# Patient Record
Sex: Female | Born: 1960 | Race: White | Hispanic: No | Marital: Married | Smoking: Former smoker
Health system: Southern US, Community
[De-identification: ages and names within clinical notes are randomized; demographics above are authoritative.]

## PROBLEM LIST (undated history)

## (undated) DIAGNOSIS — G4730 Sleep apnea, unspecified: Secondary | ICD-10-CM

## (undated) DIAGNOSIS — I10 Essential (primary) hypertension: Secondary | ICD-10-CM

## (undated) DIAGNOSIS — R112 Nausea with vomiting, unspecified: Secondary | ICD-10-CM

## (undated) DIAGNOSIS — R51 Headache: Secondary | ICD-10-CM

## (undated) DIAGNOSIS — J45909 Unspecified asthma, uncomplicated: Secondary | ICD-10-CM

## (undated) DIAGNOSIS — Z9889 Other specified postprocedural states: Secondary | ICD-10-CM

## (undated) HISTORY — PX: ROTATOR CUFF REPAIR: SHX139

## (undated) HISTORY — PX: CHOLECYSTECTOMY: SHX55

## (undated) HISTORY — PX: ABDOMINAL HYSTERECTOMY: SHX81

## (undated) HISTORY — PX: BUNIONECTOMY: SHX129

## (undated) HISTORY — PX: JOINT REPLACEMENT: SHX530

## (undated) HISTORY — PX: APPENDECTOMY: SHX54

## (undated) HISTORY — PX: TONSILLECTOMY: SUR1361

## (undated) HISTORY — PX: LAPAROSCOPIC GASTRIC SLEEVE RESECTION: SHX5895

---

## 1999-04-18 ENCOUNTER — Other Ambulatory Visit: Admission: RE | Admit: 1999-04-18 | Discharge: 1999-04-18 | Payer: Self-pay | Admitting: Obstetrics and Gynecology

## 1999-05-26 ENCOUNTER — Emergency Department (HOSPITAL_COMMUNITY): Admission: EM | Admit: 1999-05-26 | Discharge: 1999-05-26 | Payer: Self-pay | Admitting: Emergency Medicine

## 1999-09-22 ENCOUNTER — Emergency Department (HOSPITAL_COMMUNITY): Admission: EM | Admit: 1999-09-22 | Discharge: 1999-09-22 | Payer: Self-pay

## 1999-09-28 ENCOUNTER — Encounter: Payer: Self-pay | Admitting: Emergency Medicine

## 1999-09-28 ENCOUNTER — Emergency Department (HOSPITAL_COMMUNITY): Admission: EM | Admit: 1999-09-28 | Discharge: 1999-09-28 | Payer: Self-pay | Admitting: Emergency Medicine

## 2000-01-10 ENCOUNTER — Emergency Department (HOSPITAL_COMMUNITY): Admission: EM | Admit: 2000-01-10 | Discharge: 2000-01-10 | Payer: Self-pay | Admitting: Emergency Medicine

## 2000-01-17 ENCOUNTER — Encounter: Admission: RE | Admit: 2000-01-17 | Discharge: 2000-01-17 | Payer: Self-pay | Admitting: Otolaryngology

## 2000-01-17 ENCOUNTER — Encounter: Payer: Self-pay | Admitting: Otolaryngology

## 2000-02-02 ENCOUNTER — Ambulatory Visit (HOSPITAL_BASED_OUTPATIENT_CLINIC_OR_DEPARTMENT_OTHER): Admission: RE | Admit: 2000-02-02 | Discharge: 2000-02-02 | Payer: Self-pay | Admitting: Otolaryngology

## 2000-02-04 ENCOUNTER — Emergency Department (HOSPITAL_COMMUNITY): Admission: EM | Admit: 2000-02-04 | Discharge: 2000-02-04 | Payer: Self-pay | Admitting: Emergency Medicine

## 2000-03-31 ENCOUNTER — Emergency Department (HOSPITAL_COMMUNITY): Admission: EM | Admit: 2000-03-31 | Discharge: 2000-03-31 | Payer: Self-pay | Admitting: Emergency Medicine

## 2000-04-24 ENCOUNTER — Emergency Department (HOSPITAL_COMMUNITY): Admission: EM | Admit: 2000-04-24 | Discharge: 2000-04-25 | Payer: Self-pay

## 2000-05-29 ENCOUNTER — Encounter: Payer: Self-pay | Admitting: Emergency Medicine

## 2000-05-29 ENCOUNTER — Emergency Department (HOSPITAL_COMMUNITY): Admission: EM | Admit: 2000-05-29 | Discharge: 2000-05-29 | Payer: Self-pay | Admitting: Emergency Medicine

## 2000-08-25 ENCOUNTER — Inpatient Hospital Stay (HOSPITAL_COMMUNITY): Admission: EM | Admit: 2000-08-25 | Discharge: 2000-08-31 | Payer: Self-pay | Admitting: Emergency Medicine

## 2000-08-25 ENCOUNTER — Encounter: Payer: Self-pay | Admitting: Emergency Medicine

## 2000-08-29 ENCOUNTER — Encounter: Payer: Self-pay | Admitting: Cardiology

## 2000-08-29 ENCOUNTER — Encounter: Payer: Self-pay | Admitting: Interventional Cardiology

## 2000-09-06 ENCOUNTER — Emergency Department (HOSPITAL_COMMUNITY): Admission: EM | Admit: 2000-09-06 | Discharge: 2000-09-06 | Payer: Self-pay | Admitting: Emergency Medicine

## 2000-09-06 ENCOUNTER — Encounter: Payer: Self-pay | Admitting: Emergency Medicine

## 2002-06-01 ENCOUNTER — Encounter: Payer: Self-pay | Admitting: *Deleted

## 2002-06-01 ENCOUNTER — Inpatient Hospital Stay (HOSPITAL_COMMUNITY): Admission: EM | Admit: 2002-06-01 | Discharge: 2002-06-04 | Payer: Self-pay | Admitting: Emergency Medicine

## 2002-06-02 ENCOUNTER — Encounter (INDEPENDENT_AMBULATORY_CARE_PROVIDER_SITE_OTHER): Payer: Self-pay | Admitting: Cardiology

## 2002-06-03 ENCOUNTER — Encounter: Payer: Self-pay | Admitting: Family Medicine

## 2002-06-08 ENCOUNTER — Encounter: Admission: RE | Admit: 2002-06-08 | Discharge: 2002-06-08 | Payer: Self-pay | Admitting: Family Medicine

## 2002-09-23 ENCOUNTER — Emergency Department (HOSPITAL_COMMUNITY): Admission: EM | Admit: 2002-09-23 | Discharge: 2002-09-23 | Payer: Self-pay | Admitting: Emergency Medicine

## 2002-09-23 ENCOUNTER — Encounter: Payer: Self-pay | Admitting: Emergency Medicine

## 2003-02-25 ENCOUNTER — Inpatient Hospital Stay (HOSPITAL_COMMUNITY): Admission: EM | Admit: 2003-02-25 | Discharge: 2003-03-01 | Payer: Self-pay | Admitting: Emergency Medicine

## 2003-02-25 ENCOUNTER — Encounter: Payer: Self-pay | Admitting: Emergency Medicine

## 2003-02-26 ENCOUNTER — Encounter: Payer: Self-pay | Admitting: Cardiology

## 2003-09-07 ENCOUNTER — Emergency Department (HOSPITAL_COMMUNITY): Admission: EM | Admit: 2003-09-07 | Discharge: 2003-09-07 | Payer: Self-pay | Admitting: Emergency Medicine

## 2003-11-10 ENCOUNTER — Emergency Department (HOSPITAL_COMMUNITY): Admission: EM | Admit: 2003-11-10 | Discharge: 2003-11-10 | Payer: Self-pay | Admitting: Emergency Medicine

## 2003-12-03 ENCOUNTER — Ambulatory Visit (HOSPITAL_COMMUNITY): Admission: RE | Admit: 2003-12-03 | Discharge: 2003-12-03 | Payer: Self-pay | Admitting: Orthopaedic Surgery

## 2003-12-06 ENCOUNTER — Emergency Department (HOSPITAL_COMMUNITY): Admission: EM | Admit: 2003-12-06 | Discharge: 2003-12-06 | Payer: Self-pay | Admitting: Emergency Medicine

## 2004-05-12 ENCOUNTER — Ambulatory Visit (HOSPITAL_COMMUNITY): Admission: RE | Admit: 2004-05-12 | Discharge: 2004-05-12 | Payer: Self-pay | Admitting: Orthopaedic Surgery

## 2004-05-17 ENCOUNTER — Inpatient Hospital Stay (HOSPITAL_COMMUNITY): Admission: RE | Admit: 2004-05-17 | Discharge: 2004-05-22 | Payer: Self-pay | Admitting: Orthopaedic Surgery

## 2005-08-03 ENCOUNTER — Inpatient Hospital Stay (HOSPITAL_COMMUNITY): Admission: RE | Admit: 2005-08-03 | Discharge: 2005-08-08 | Payer: Self-pay | Admitting: Orthopaedic Surgery

## 2005-10-01 ENCOUNTER — Ambulatory Visit: Admission: RE | Admit: 2005-10-01 | Discharge: 2005-10-01 | Payer: Self-pay | Admitting: Orthopaedic Surgery

## 2006-08-19 ENCOUNTER — Ambulatory Visit (HOSPITAL_COMMUNITY): Admission: RE | Admit: 2006-08-19 | Discharge: 2006-08-20 | Payer: Self-pay | Admitting: Orthopaedic Surgery

## 2006-11-22 ENCOUNTER — Encounter: Admission: RE | Admit: 2006-11-22 | Discharge: 2006-11-22 | Payer: Self-pay | Admitting: Orthopaedic Surgery

## 2006-12-04 ENCOUNTER — Encounter: Admission: RE | Admit: 2006-12-04 | Discharge: 2006-12-04 | Payer: Self-pay | Admitting: Orthopaedic Surgery

## 2007-10-06 ENCOUNTER — Ambulatory Visit: Payer: Self-pay | Admitting: Internal Medicine

## 2007-11-09 ENCOUNTER — Other Ambulatory Visit: Payer: Self-pay

## 2007-11-09 ENCOUNTER — Ambulatory Visit: Payer: Self-pay | Admitting: Emergency Medicine

## 2007-11-14 ENCOUNTER — Ambulatory Visit: Payer: Self-pay | Admitting: Family Medicine

## 2007-12-19 ENCOUNTER — Ambulatory Visit: Payer: Self-pay | Admitting: Family Medicine

## 2007-12-25 ENCOUNTER — Ambulatory Visit: Payer: Self-pay | Admitting: Emergency Medicine

## 2008-01-03 ENCOUNTER — Ambulatory Visit: Payer: Self-pay | Admitting: Family Medicine

## 2008-01-07 ENCOUNTER — Encounter: Admission: RE | Admit: 2008-01-07 | Discharge: 2008-01-07 | Payer: Self-pay | Admitting: Internal Medicine

## 2008-01-13 ENCOUNTER — Ambulatory Visit: Payer: Self-pay | Admitting: Family Medicine

## 2008-06-21 ENCOUNTER — Ambulatory Visit: Payer: Self-pay | Admitting: Internal Medicine

## 2008-10-13 ENCOUNTER — Ambulatory Visit: Payer: Self-pay | Admitting: Family Medicine

## 2008-10-19 ENCOUNTER — Ambulatory Visit: Payer: Self-pay | Admitting: Otolaryngology

## 2008-10-31 ENCOUNTER — Ambulatory Visit: Payer: Self-pay | Admitting: Family Medicine

## 2008-11-22 ENCOUNTER — Inpatient Hospital Stay: Payer: Self-pay | Admitting: Internal Medicine

## 2008-12-13 ENCOUNTER — Ambulatory Visit: Payer: Self-pay | Admitting: Internal Medicine

## 2009-01-21 IMAGING — CR DG CHEST 1V PORT
1 series · 1 of 1 positions shown · non-contrast
Comparison: none

REASON FOR EXAM: cp/sob
COMMENTS:

PROCEDURE:     DXR - DXR PORTABLE CHEST SINGLE VIEW  - November 22, 2008 [DATE]
RESULT:     Comparison is made to a prior study dated 11/09/2007.
The lungs are clear. The cardiac silhouette and visualized bony skeleton are
unremarkable.

[view not recorded]
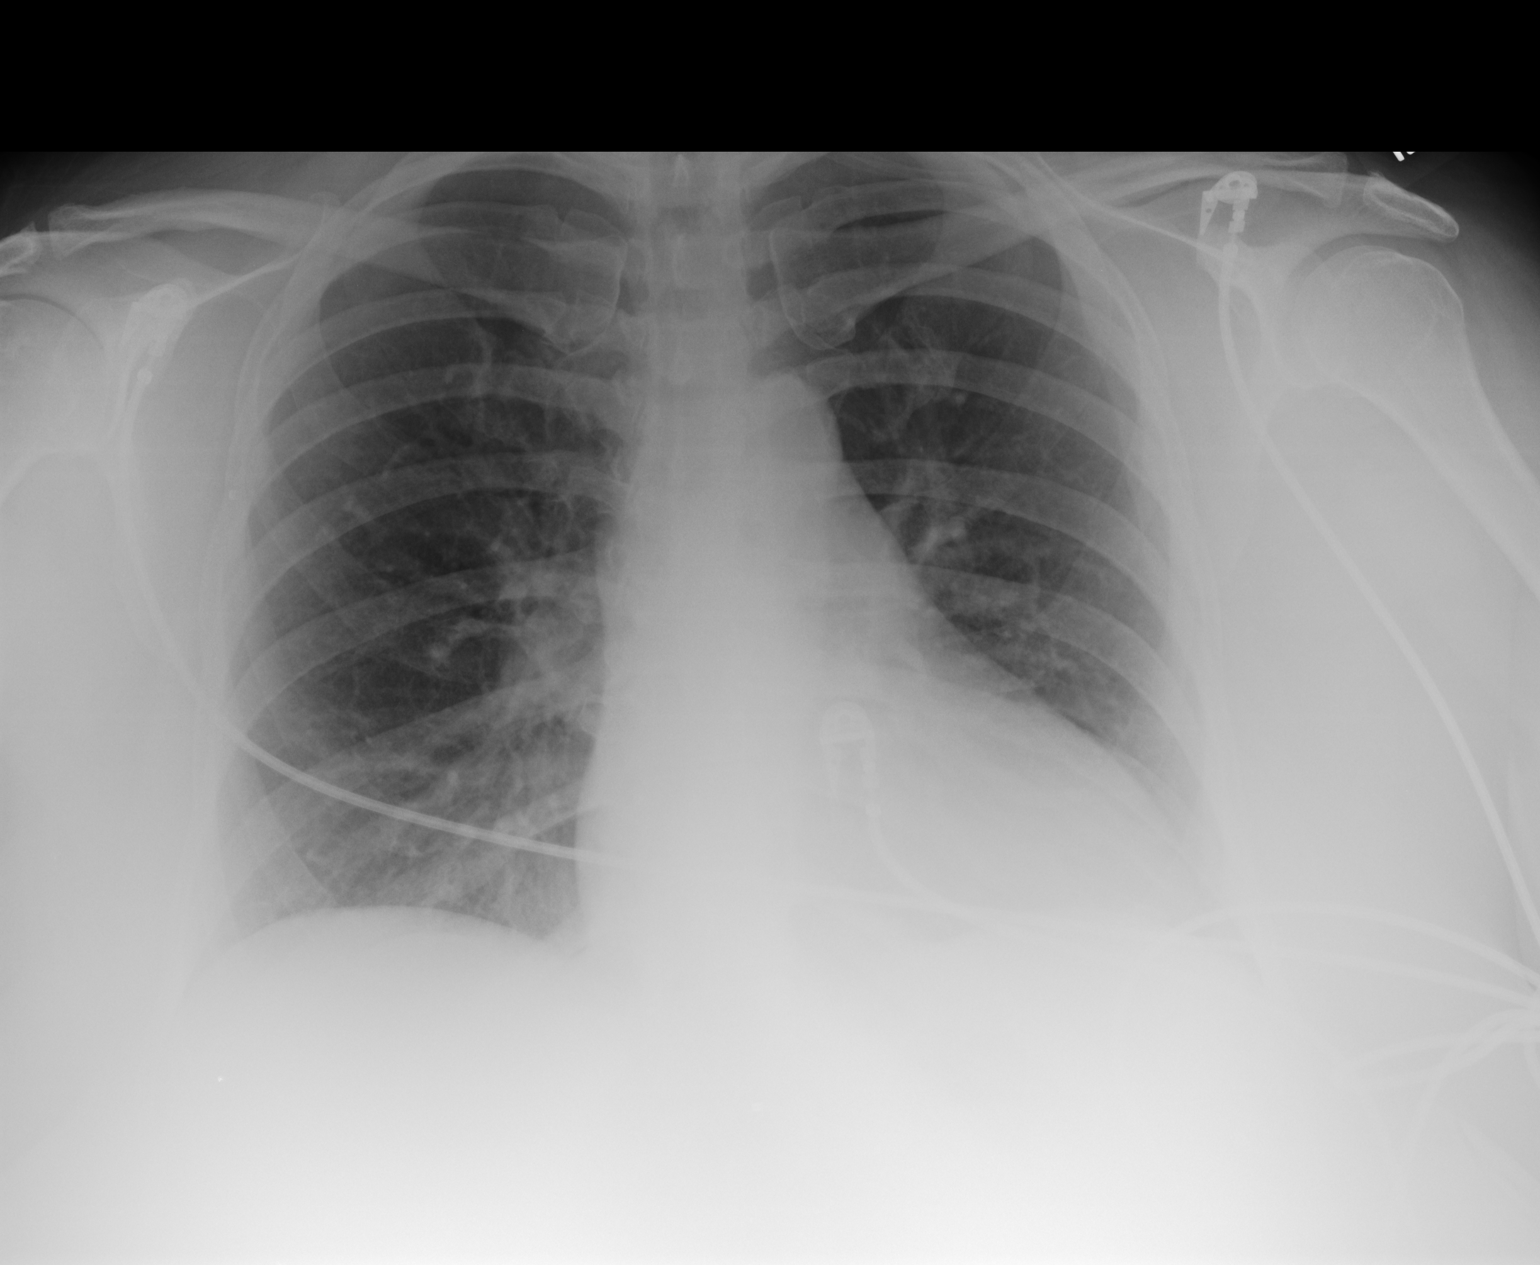

[1 of 1 positions shown; findings below may reference images not displayed]

IMPRESSION: Chest radiograph without evidence of acute cardiopulmonary
disease.

## 2009-03-02 ENCOUNTER — Ambulatory Visit: Payer: Self-pay | Admitting: Internal Medicine

## 2009-06-14 ENCOUNTER — Ambulatory Visit: Payer: Self-pay | Admitting: Family Medicine

## 2009-11-08 ENCOUNTER — Inpatient Hospital Stay: Payer: Self-pay | Admitting: Internal Medicine

## 2010-03-06 ENCOUNTER — Ambulatory Visit: Payer: Self-pay | Admitting: Unknown Physician Specialty

## 2010-03-20 ENCOUNTER — Emergency Department: Payer: Self-pay | Admitting: Unknown Physician Specialty

## 2010-04-14 ENCOUNTER — Ambulatory Visit: Payer: Self-pay | Admitting: Surgery

## 2010-04-18 ENCOUNTER — Ambulatory Visit: Payer: Self-pay | Admitting: Surgery

## 2010-04-21 ENCOUNTER — Ambulatory Visit: Payer: Self-pay | Admitting: Internal Medicine

## 2010-04-26 ENCOUNTER — Ambulatory Visit: Payer: Self-pay | Admitting: Cardiovascular Disease

## 2010-04-26 ENCOUNTER — Encounter: Payer: Self-pay | Admitting: Otolaryngology

## 2010-04-30 ENCOUNTER — Ambulatory Visit: Payer: Self-pay | Admitting: Internal Medicine

## 2010-05-01 ENCOUNTER — Emergency Department: Payer: Self-pay | Admitting: Emergency Medicine

## 2010-05-05 ENCOUNTER — Encounter: Payer: Self-pay | Admitting: Otolaryngology

## 2010-06-06 ENCOUNTER — Ambulatory Visit: Payer: Self-pay | Admitting: Otolaryngology

## 2010-07-22 ENCOUNTER — Ambulatory Visit: Payer: Self-pay | Admitting: Internal Medicine

## 2010-08-17 ENCOUNTER — Ambulatory Visit: Payer: Self-pay | Admitting: Specialist

## 2010-08-28 ENCOUNTER — Inpatient Hospital Stay (HOSPITAL_COMMUNITY): Admission: RE | Admit: 2010-08-28 | Discharge: 2010-08-29 | Payer: Self-pay | Admitting: Orthopaedic Surgery

## 2010-11-15 ENCOUNTER — Encounter: Payer: Self-pay | Admitting: Otolaryngology

## 2010-12-06 ENCOUNTER — Encounter: Payer: Self-pay | Admitting: Otolaryngology

## 2011-01-04 ENCOUNTER — Encounter: Payer: Self-pay | Admitting: Otolaryngology

## 2011-01-17 ENCOUNTER — Ambulatory Visit: Payer: Self-pay | Admitting: Cardiovascular Disease

## 2011-01-17 HISTORY — PX: CARDIAC CATHETERIZATION: SHX172

## 2011-01-17 LAB — COMPREHENSIVE METABOLIC PANEL
ALT: 21 U/L (ref 0–35)
AST: 25 U/L (ref 0–37)
Albumin: 3.8 g/dL (ref 3.5–5.2)
Alkaline Phosphatase: 98 U/L (ref 39–117)
BUN: 12 mg/dL (ref 6–23)
CO2: 28 mEq/L (ref 19–32)
Calcium: 9.6 mg/dL (ref 8.4–10.5)
Chloride: 102 mEq/L (ref 96–112)
Creatinine, Ser: 0.75 mg/dL (ref 0.4–1.2)
GFR calc Af Amer: >60 mL/min (ref >60)
GFR calc non Af Amer: >60 mL/min (ref >60)
Glucose, Bld: 101 mg/dL — ABNORMAL HIGH (ref 70–99)
Potassium: 4 mEq/L (ref 3.5–5.1)
Sodium: 139 mEq/L (ref 135–145)
Total Bilirubin: 0.3 mg/dL (ref 0.3–1.2)
Total Protein: 6.6 g/dL (ref 6.0–8.3)

## 2011-01-17 LAB — URINALYSIS, ROUTINE W REFLEX MICROSCOPIC
Bilirubin Urine: NEGATIVE
Glucose, UA: NEGATIVE mg/dL
Hgb urine dipstick: NEGATIVE
Ketones, ur: NEGATIVE mg/dL
Nitrite: NEGATIVE
Protein, ur: NEGATIVE mg/dL
Specific Gravity, Urine: 1.035 — ABNORMAL HIGH (ref 1.005–1.030)
Urobilinogen, UA: 0.2 mg/dL (ref 0.0–1.0)
pH: 5.5 (ref 5.0–8.0)

## 2011-01-17 LAB — CBC
HCT: 36.1 % (ref 36.0–46.0)
Hemoglobin: 11.7 g/dL — ABNORMAL LOW (ref 12.0–15.0)
MCH: 27.9 pg (ref 26.0–34.0)
MCHC: 32.4 g/dL (ref 30.0–36.0)
MCV: 86.2 fL (ref 78.0–100.0)
Platelets: 343 10*3/uL (ref 150–400)
RBC: 4.19 MIL/uL (ref 3.87–5.11)
RDW: 13.5 % (ref 11.5–15.5)
WBC: 7.5 10*3/uL (ref 4.0–10.5)

## 2011-01-17 LAB — PROTIME-INR
INR: 0.94 (ref 0.00–1.49)
Prothrombin Time: 12.8 seconds (ref 11.6–15.2)

## 2011-01-17 LAB — SURGICAL PCR SCREEN
MRSA, PCR: NEGATIVE
Staphylococcus aureus: NEGATIVE

## 2011-01-17 LAB — APTT: aPTT: 27 seconds (ref 24–37)

## 2011-03-23 NOTE — Discharge Summary (Signed)
Karina [name], Karina [name]                        ACCOUNT NO.:  [id number]   MEDICAL RECORD NO.:  [id number]                   PATIENT TYPE:  INP   LOCATION:  2029                                 FACILITY:  [facility]   PHYSICIAN:  Alvira Philips, M.D.                DATE OF BIRTH:  1961/01/13   DATE OF ADMISSION:  06/01/2002  DATE OF DISCHARGE:  06/04/2002                                 DISCHARGE SUMMARY   DISCHARGE DIAGNOSES:  1. Atypical chest pain.  2. Bipolar disorder.  3. Hypertension.  4. Asthma.  5. Hypokalemia.  6. Vaginal discharge.   DISCHARGE MEDICATIONS:  1. Darvocet-N 50 one or two tablets p.o. q.4-6h. p.r.n. pain.  2. Cardizem CD 90 mg one tablet b.i.d.  3. Aspirin 325 mg one tab q.d.  4. Wellbutrin 100 mg p.o. b.i.d.  5. Prozac 80 mg p.o. q.h.s.  6. Doxepin 50 mg p.o. q.h.s.  7. Prednisone taper, the patient is to take 40 mg q.d. x3 days, then 20 mg     q.d. x3 days, then quit.  8. The patient is to discontinue her use of Accupril and hydrochlorothiazide     as previously instructed as home medications.   ACTIVITY:  The patient is to return to work on Monday, June 08, 2002.   DIAGNOSIS:  The patient is to maintain a low sodium diet.   SPECIAL INSTRUCTIONS:  The patient is to try sublingual nitroglycerin first  for her chest pain if reoccurring.  The patient is to take 0.05% tablet one  tab every 5 minutes x3 doses for chest pain, and then the patient is  instructed to use pain relief from the Darvocet as well.   FOLLOWUP:  The patient is to follow up with Dr. Sharyn Lull at his office on  June 18, 2002 at 2 p.m.  Phone number is [phone].   CHIEF COMPLAINT:  The patient was admitted for a chief complaint of chest  pain for three weeks with worsening on the date of admission.   HISTORY OF PRESENT ILLNESS:  The patient is a 50 year old white female with  a history of bipolar disorder, obesity, hypertension, and positive family  history of vascular disease who  presented with a three-week history of  shortness of breath and chest pain.  The patient says she originally thought  it was asthma causing her shortness of breath and chest pain, actually was  seen at [facility].  An EKG at her primary care physician showed no  abnormalities.  The patient says her chest pain never really went away.  She  described it as a elephant sitting on the side of her chest with a 7/10 pain  scale, and that the pain radiated to her left shoulder.  She was complaining  that her chest pain and shortness of breath were worse with walking and with  any type of activity.  What actually brought the patient  to the hospital was  the patient was sitting at her desk at work when she began to feel light-  headed and felt like she was floating.  She also claimed that she was hot  and clammy and had palpitations.  She went back to see her physician at  Urgent Care, and a second EKG obtained showed some mild changes.  The  patient was sent to the hospital for workup.   PHYSICAL EXAMINATION:  VITAL SIGNS:  Temperature 98.7, pulse 88, respiratory  rate 17, blood pressure 102/61, saturation 99% on room air.  GENERAL:  She is a pleasant, very obese lady who is working hard to breathe  when she talks at rest.  The patient was much more comfortable with her  breathing without exertion.  HEENT:  Unremarkable.  CHEST:  Wheezing bilaterally on inspiration and expiration.  CARDIOVASCULAR:  Regular rate and rhythm, no murmurs, radial pulses were 2+,  pedal pulses were 2+,  and no carotid bruits were heard.  ABDOMEN:  Positive bowel sounds, soft, tender to palpation of the right  upper quadrant and left upper quadrant to deep palpation.  EXTREMITIES:  No cyanosis, clubbing, or edema.   The rest of her physical examination was unremarkable.   LABORATORY DATA:  White count of 10.6, hemoglobin 18.8, hematocrit 37.7,  platelets 150,000.  Sodium 139, potassium 3.9, chloride 100, bicarbonate  30,  BUN 10, creatinine 0.6, glucose 97.  Her CK was 50.  PT was 12.0, PTT was  29.0, and troponin-I initially was less than 0.1.  An EKG at the time  revealed a rate of 99, normal axis and sinus rhythm, possibly some mild T-  wave inversions in leads 2, 3, and AVF, this was a change from EKG obtained  on May 14, 2002, at her primary care physician's office.  A chest x-ray  obtained in the ER showed no acute disease.  The patient was admitted for  the following diagnoses:   HOSPITAL COURSE:  1. ATYPICAL CHEST PAIN:  The patient has positive risk factors, including     obesity, positive family history, and hypertension that contribute to     this chest pain.  However, this chest pain presents a very atypical     fashion given a three week history and no relief by position or     nitroglycerin.  The patient was known to have a 30% blockage in her LAD     approximately two years by cardiac catheterization, however, this     represents a minima blockage, would unlikely cause such chest pain.  The     patient also had stomach symptoms of palpitations, diaphoresis, and     shortness of breath along with her chest pain.  The patient was first     started on a nitroglycerin drip and started on heparin at 5000 units IV     bolus, and was placed on an aspirin a day.  Her guaiac stools were     negative before starting the heparin.  We were concerned that this was an     atypical presentation of coronary artery disease, however, a second and     third set of enzymes were negative.  At that time, the heparin was     discontinued.  The patient was also requiring oxygen 2 L nasal cannula     for shortness of breath.  We held her beta blocker secondary to her     reactive airway disease.  The patient  was receiving nitroglycerin and IV     morphine to relieve her pain, but the pain never fully subsided during    hospital admission.  Cardiology consult was obtained secondary to the     patient's  prominent history and risk factors as well, and for __________     of this atypical chest pain.  A 2-D echocardiogram was obtained which     showed no wall abnormalities and an ejection fraction of 50 to 55% , some     left ventricular outlet obstruction, and no wall thickness changes. At     that point in time, the cardiologist recommended that we discontinue her     hydrochlorothiazide, avoid beta blockers, discontinue her __________ ,     discontinue her Accupril secondary to outlet obstruction.  A stress test     was recommended and performed which showed some minor ST depression in     anterior lateral and inferior leads at baseline, no arrhythmias, and no     new acute changes, however, the patient did complain of some chest     tightness during examination, therefore, final examination revealed as a     positive stress test.  Cardiolite was performed on followup which showed     extending of the anterior and inferolateral wall with no ischemia and an     ejection fraction of 62%.  Lipid panel was also obtained to risk stratify     the patient.  It revealed a cholesterol of 169, triglycerides 159, HDL     57, LDL 75.  The patient was started on Cardizem 60 mg p.o. q.8h.     initially for both hypertension control and left ventricular outlet     obstruction.  The patient was changed over to Cardizem CD 90 mg p.o.     b.i.d. on discharge, as per cardiology's request.  Basically, we believe     that this is some form of pericarditis or the fact that her history of     anxiety and asthma are causing this chest pain.  Truly believe that this     it not cardiac in origin at this time secondary to the only positive     finding of left ventricular outlet obstruction being highly unlikely to     cause such atypical chest pain.  The patient will though follow up with     Dr. Sharyn Lull at his office on June 18, 2002, at 2 p.m.   1. BIPOLAR DISORDER:  The patient was admitted taking Prozac,  Wellbutrin,     Doxepin.  The patient was recently started on Wellbutrin approximately     two weeks ago, and was believed to be taking 200 mg b.i.d. instead of 200     mg q.d. divided into 100 mg p.o. b.i.d. course.  The patient was     instructed to take 100 mg b.i.d. Wellbutrin on discharge, and continue     her Prozac and Doxepin as recommended by [facility].  The     patient does admit to a recent history of life stressors, and wondering     if the patient's depression has some causes of her psychogenic chest pain     as well.   1. HYPERTENSION:  The patient came in taking hydrochlorothiazide and ACE     inhibitor.  The patient was originally started on those medications,     however, secondary to echocardiogram findings, we discontinued both of  those medications and the patient was started on Cardizem CD upon     discharge, and told not to take her hydrochlorothiazide and ACE    inhibitor, as these would both likely reduce the preload for the patient     which will exacerbate her outlet obstruction.  The patient's blood     pressure was stable throughout hospital admission.   1. ASTHMA:  The patient had a chest x-ray which showed no acute disease.     The patient was restarted on her albuterol and Atrovent nebs for her     shortness of breath and given 2 L of O2 via nasal cannula.  The patient     continued to have good oxygen saturation throughout all of admission,     despite complaining of some shortness of breath components from day to     day.  Likely that some of the anxiety worry over the chest pain was     leading to some of the patient's feeling of shortness of breath despite     no signs of any reactive airway disease.  The patient will continue on     her home doses of albuterol and Atrovent nebs on discharge.  The patient     was weaned off of O2 nasal cannula and saturating well on discharge.   1. HYPOKALEMIA:  The patient was repleted with K-Dur  p.o. replacement 120     mEq and showed adequate response.   1. VAGINAL DISCHARGE:  The patient was admitted with a yellowish discharge     per the vagina for approximately two to three days.  The patient has a     history of having a hysterectomy.  A wet prep and bimanual examination     were performed which revealed external genitalia with no vesicles,     erythema, or rashes.  The patient had a strong odor with no obvious     discharge, and masses were felt on bimanual examination.  The patient was     encouraged to follow up with her annual normal exams.   PROCEDURE:  1. Chest x-ray.  2. Echocardiogram examination.  3. Persantine stress test.  4. Cardiolite.   CONSULTATIONS:  Cardiology.    CONDITION ON DISCHARGE:  The patient was discharged with improvement of  chest pain on June 04, 2002, and told to follow up with Dr. Sharyn Lull.                                               Alvira Philips, M.D.    RM/MEDQ  D:  06/13/2002  T:  06/17/2002  Job:  [id number]   cc:   Robynn Pane, M.D.

## 2011-03-23 NOTE — Discharge Summary (Signed)
Karina [name], Karina [name]              ACCOUNT NO.:  [id number]   MEDICAL RECORD NO.:  [id number]          PATIENT TYPE:  INP   LOCATION:  5006                         FACILITY:  [facility]   PHYSICIAN:  Mark C. Ophelia Charter, M.D.    DATE OF BIRTH:  29-Sep-1961   DATE OF ADMISSION:  08/03/2005  DATE OF DISCHARGE:  08/08/2005                                 DISCHARGE SUMMARY   FINAL DIAGNOSIS:  Right knee osteoarthritis.   ADDITIONAL DIAGNOSES:  1.  Status post left total knee arthroplasty.  2.  Urinary tract infection.   This 50 year old female has had previous left total knee arthroplasty, doing  well, and now presented with severe osteoarthritis, right total knee, with  range of motion 5-90 degrees, persistent effusion, despite anti-  inflammatories. Routine admission labs included white count of 11,500,  hemoglobin 13.1, normal PT and PTT. Glucose was 93 on admission. Initial  urinalysis on admission, July 31, 2005, was normal.  On August 30, 2005, the patient was having some symptoms and urinalysis showed moderate  leukocyte esterase with 7-10 wbc's and many bacteria suggestive of a UTI.   HOSPITAL COURSE:  The patient was admitted after informed consent with  preoperative antibiotics and underwent total knee arthroplasty. X-rays  showed good cement mantle, she was out of bed to recliner, working with  physical therapy. She also received Coumadin for DVT prophylaxis. By August 07, 2005, she was at 50 degrees on CPM. Home health was arranged and she was  placed on some Cipro 500 mg b.i.d. times five days for probable UTI. She was  taking Tylox for pain, home health was arranged, and incision was healing  nicely.   CONDITION ON DISCHARGE:  Improved.      Mark C. Ophelia Charter, M.D.  Electronically Signed     MCY/MEDQ  D:  09/24/2005  T:  09/24/2005  Job:  [id number]

## 2011-03-23 NOTE — Cardiovascular Report (Signed)
[place]  Patient:    Karina [name], Karina [name]                     MRN: [id number] Proc. Date: 08/30/00 Adm. Date:  [id number] Disc. Date: [id number] Attending:  Terald Sleeper CC:         Terald Sleeper, M.D.  Francisca December, M.D.  [facility] Records/Catheterization Lab   Cardiac Catheterization  INDICATIONS:  Recurrent chest pain in this obese 50 year old female with a positive Cardiolite study for evidence of anterior ischemia.  This study is being done to rule out significant coronary artery obstructive disease.  PROCEDURES PERFORMED: 1.  Left heart catheterization. 2.  Selective coronary angiography. 3.  Left ventriculography. 4.  Perclose arteriotomy closure.  DESCRIPTION OF PROCEDURE:  After informed consent, the patient was taken to the cardiac catheterization laboratory where a 6-French sheath was started in the right femoral artery using the modified Seldinger technique.  A 6-French A2 multipurpose catheter was used for hemodynamic recordings, left ventriculography, and selective left and right coronary angiography.  A sheathogram was performed in the right femoral artery without difficulty, demonstrating good positioning for Perclose.  The patient tolerated the Perclose arteriotomy closure without complications.  RESULTS:  I.   HEMODYNAMIC DATA:  Aortic pressure 192/114.  Left ventricular pressure      187/25 mmHg. II.  LEFT VENTRICULOGRAPHY:  The left ventricle is opacified by hand      injection.  Faint opacification was noted.  No regional wall motion      abnormality was seen.  The estimated ejection fraction was at least      50%. III. SELECTIVE CORONARY ANGIOGRAPHY: A.   Left main coronary:  Normal. B.   Left anterior descending coronary:  The left anterior descending      coronary artery is a large vessel and gives origin to a large      branching first diagonal.  Right after this diagonal, there are       irregularities in the LAD that obstruct the artery by up to 30%.      No high grade obstruction is noted.  The LAD wraps around the left      ventricular apex. C.   Circumflex artery:  The circumflex artery is large.  It gives      origin to one dominant trifurcating obtuse marginal branch.  The      circumflex is normal. D.   Right coronary artery:  The right coronary artery is a large      dominant vessel free of any significant atherosclerotic lesions.      It is essentially normal.  CONCLUSIONS: 1.  Minimal left anterior descending artery disease with 20% to 30% narrowing     after the first diagonal.  The circumflex and right coronary artery     systems are entirely normal. 2.  Normal left ventricular function, with elevated end-diastolic pressure     suggesting diastolic dysfunction. 3.  False-positive stress Cardiolite/adenosine Cardiolite study. 4.  Successful Perclose.  PLAN:  Observation overnight to look for evidence of groin bleeding.  Blood pressure needs to be better controlled.  Hopeful discharge by Dr. Leanord Hawking on August 31, 2000. DD:  08/30/00 TD:  08/30/00 Job: [id number]

## 2011-03-23 NOTE — Op Note (Signed)
Karina [name], Karina [name]              ACCOUNT NO.:  [id number]   MEDICAL RECORD NO.:  [id number]          PATIENT TYPE:  INP   LOCATION:  2899                         FACILITY:  [facility]   PHYSICIAN:  Mark C. Ophelia Charter, M.D.    DATE OF BIRTH:  18-Oct-1961   DATE OF PROCEDURE:  10/01/2005  DATE OF DISCHARGE:  10/01/2005                                 OPERATIVE REPORT   PREOPERATIVE DIAGNOSIS:  Post right total knee arthroplasty with  arthrofibrosis.   POSTOPERATIVE DIAGNOSIS:  Post right total knee arthroplasty with  arthrofibrosis.   PROCEDURE:  Diagnostic and operative arthroscopy, right knee, with  manipulation under anesthesia and debridement of intra-articular scar tissue  and partial synovectomy.   SURGEON:  Mark C. Ophelia Charter, M.D.   ANESTHESIA:  GOT.   DESCRIPTION OF PROCEDURE:  After induction of anesthesia and preoperative  Ancef, the patient was intubated.  After succinylcholine and going through  stage II of anesthesia with fairly good muscle relaxant, the knee was flexed  to 110 degrees with popping of scar tissue.  The knee was taken out near  extension and there was springiness on attempts to reach full extension.  She had about 25 degrees of flexion.  Standard prepping and draping were  performed with arthroscopic sheets and draped.  Inflow was placed through a  scope and it was difficult with a large leg, with the patient 5 feet 6  inches and 322 pounds, to determine exactly where the joint line was  infrapatellar.  Local was infiltrated and the tibial prosthesis was palpated  with a needle on the anterior aspect of the baseline plate.  A poke hole was  made with a scalpel and the knee was entered.  A __________  shaver was used  for debridement and about 5-10 minutes of debridement were necessary before  the prosthesis could be visualized.  The notch was debrided and scar tissue  was debrided in between the polyethylene tray and femoral prosthesis.  Knee  was able to be  flexed to 110 degrees easily and then passively letting go  with the hip flexed, it would stay at 110 degrees of flexion.  With mild  pressure, flexion would increase to 120 degrees.  Further debridement was  performed.  Attempt to reach the suprapatellar pouch was unsuccessful from  the parapatellar incisions due to the extensive subcutaneous adipose layer.  In order to get down to the knee, a stab incision was made superolateral and  a trocar was introduced in the suprapatellar pouch and the pouch was re-  created with the blunt trocar from the inflow cannula.  Scope was introduced  and the undersurface of the patella was visualized.  A second dose of  succinylcholine was given and the patient was manipulated, applying pressure  gradually directly over the patella, bringing the knee further out to  extension and there were probably 5-10 degrees remaining, reaching full  extensive passively, but there was concern that further pressure might risk  bone injury.  After thorough irrigation, the arthroscope was then placed  back down and some further debridement was performed.  Knee was suctioned  dry, nylon sutures were placed in the portals, 4 x 4's, Webril, Ace wrap and  transferred to the recovery room.  Instrument count and needle count were  correct.     Mark C. Ophelia Charter, M.D.  Electronically Signed    MCY/MEDQ  D:  10/01/2005  T:  10/02/2005  Job:  [id number]

## 2011-03-23 NOTE — Op Note (Signed)
[place]  Patient:    Karina [name], Karina [name]                       MRN: [id number] Proc. Date: 02/02/00 Attending:  Kristine Garbe. Ezzard Standing, M.D.                           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  POSTOPERATIVE DIAGNOSIS:  Chronic right ethmoid sinus disease, right maxillary sinus disease with obstruction of the right O[facility] region.  OPERATION:  Functional endoscopic sinus surgery with right ethmoidectomy and right maxillary ostial enlargement.  SURGEON:   Kristine Garbe. Ezzard Standing, M.D.  ANESTHESIA:  General.  COMPLICATIONS:  None.  BRIEF CLINICAL NOTE:  The patient is a 50 year old female who has had a history of sinus disease status post previous sinus surgery.  She has continued to have pain and discomfort in the right paranasal region.  On CT scan of the sinuses, she has some obstruction of the right [facility] region.  She has had previous reduction of her right middle turbinate, as well as right inferior meatal antrostomy.  She is taken to the operating room at this time for endoscopic right ethmoidectomy and right  maxillary ostial enlargement.  DESCRIPTION OF PROCEDURE:  First endoscopic exam of the nose was performed after prepping the face with Betadine.  The patient had a fair amount of scar tissue n the right middle meatus region.  There was some residual mass of the posterior right middle turbinate, which was scarred over into the right ostia/right OMC region.  The nose was injected with Xylocaine with epinephrine and cotton pledgets soaked in decongest and solution were used.  Next, the thorough cutter was used to open up the scar tissue from the right middle turbinate to the right [facility] region. The natural right maxillary ostia was identified with curved suction.  This was  enlarged with straight through cuts  and back biting forceps.  The maxillary sinus appeared relatively clear.  Next, using the through cut sickle knife, posterior  ethmoid cells were opened up.  A few of the previous anterior ethmoid regions had already been previously opened.  The micro debrider was used to open up the mid and posterior ethmoid region.  After opening this area up, there was no real obvious disease noted.  This basically completed the procedure and removed the scar tissue from the [facility] region, reducing some of the posterior right middle turbinate, which was left behind after previous reduction and opened up the anterior and middle s well as some of the posterior ethmoid region.  Kennedy sinus pack was then placed and hydrated with Xylocaine with epinephrine.  Of note, reducing some of the posterior middle turbinates, there was bleeding which was cauterized with suction cautery.  The patient was awakened from anesthesia and transferred to the recovery room postoperatively doing well.  She received 1 g of Ancef IV preoperatively.  DISPOSITION:  The patient was discharged to home late this morning on Keflex 500 mg b.i.d. for ten days, on Vicodin p.r.n. pain.  I will have her follow up in my office in four days to have the Kennedy sinus pack removed. DD:  02/02/00 TD:  02/02/00 Job: [id number]

## 2011-03-23 NOTE — Op Note (Signed)
NAME:  Karina [name], Karina [name]                        ACCOUNT NO.:  [id number]   MEDICAL RECORD NO.:  [id number]                   PATIENT TYPE:  INP   LOCATION:  5006                                 FACILITY:  [facility]   PHYSICIAN:  Mark C. Ophelia Charter, M.D.                 DATE OF BIRTH:  01-14-1961   DATE OF PROCEDURE:  05/17/2004  DATE OF DISCHARGE:                                 OPERATIVE REPORT   PREOPERATIVE DIAGNOSIS:  Left knee osteoarthritis.   POSTOPERATIVE DIAGNOSIS:  Left knee osteoarthritis.   PROCEDURE:  Left cemented total knee arthroplasty.   SURGEON:  Mark C. Ophelia Charter, M.D.   ASSISTANT:  Sandrea Matte, P.A.   ANESTHESIA:  GOT.   ESTIMATED BLOOD LOSS:  Minimal.   TOURNIQUET TIME:  1 hour and 45 minutes.   DESCRIPTION OF PROCEDURE:  After induction of general anesthesia with  orotracheal intubation, application of a proximal thigh tourniquet to this  obese female, the patient's leg was prepped with Duraprep.  She received  preoperative Ancef prophylaxis.  Standard prepping and draping for total  knee arthroplasty was performed.  Leg was wrapped with an Esmarch and  tourniquet inflated.  There was some leakage of the tourniquet and with  pulsatile arterial blood flow, the tourniquet was deflated and reinflated to  420 mmHg the maximum setting and then replaced.  There was minimal venous  blood present, and blood between the medial 1/3 and lateral 2/3.  Patella  was flipped over and was cut initially removing large spurs off of the edge  of the patella, removing 10 mm of bone from the patella and then removing  the marginal osteophytes on the femur.  Intramedullary hole was made up the  femur and it was cut 5 degrees left.  Next, intramedullary hole was drilled  in the tibia going through the stump of the ACL insertion site, AP cut 0  degree slope was made on the tibia.  Attention was then turned back to the  femur where sequential chamfer cuts and notch cuts were made all for a  #7.  On the tibial site, the 5 was appropriate size.  After trials were inserted,  a rotation was marked.  Keel holes were prepared.  Pulsatile lavage was  used.  Cement was vacuum mixed and inserted.  Due to the patient's large  size and difficulty in placing the prosthesis, the patella was cemented with  one separate batch after femoral and tibial component were hard.  Onlay was  down and secure.  A superficial Hemovac drain was placed.  Subcutaneous  tissue was reapproximated with 2-0 Vicryl sutures.  Needle, sponge, and  instrument counts correct.  Skin was closed with skin staples.  Marcaine was  infiltrated into the skin.  Postoperative dressing and knee immobilizer was  applied.  The patient was transferred to the recovery room in stable  condition.  Needle, sponge, and instrument counts  correct.                                               Mark C. Ophelia Charter, M.D.    MCY/MEDQ  D:  05/17/2004  T:  05/18/2004  Job:  [id number]

## 2011-03-23 NOTE — Op Note (Signed)
Karina [name], Karina [name]              ACCOUNT NO.:  [id number]   MEDICAL RECORD NO.:  [id number]          PATIENT TYPE:  INP   LOCATION:  2855                         FACILITY:  [facility]   PHYSICIAN:  Mark C. Ophelia Charter, M.D.    DATE OF BIRTH:  03/08/1961   DATE OF PROCEDURE:  08/03/2005  DATE OF DISCHARGE:                                 OPERATIVE REPORT   PREOPERATIVE DIAGNOSIS:  Right knee osteoarthritis.   POSTOPERATIVE DIAGNOSIS:  Right knee osteoarthritis.   PROCEDURE:  Right cemented total knee arthroplasty.   SURGEON:  Mark C. Ophelia Charter, M.D.   ASSISTANT:  Patrick Jupiter, R.N.F.A.   ANESTHESIA:  GOT plus Marcaine local.   TOURNIQUET TIME:  1 hour 30 minutes, down x15 and up x30, for a total of 2  hours.   COMPONENTS USED:  DePuy rotating platform PFC Sigma, #3 femur, #3 tibia, 35  mm patella.   PROCEDURE:  After induction of general anesthesia, proximal thigh tourniquet  application, heel bump, keeping the knee in flexed position, and a lateral  post, standard prepping and draping was performed with a sterile skin  marker, Betadine Vi-Drape x3 sealing the skin, impervious stockinette and  Coban.  An incision was made, superficial retinaculum was divided.  The true  retinaculum was divided with a medial parapatellar incision.  The patella  was flipped over and resected 10 mm.  Intramedullary hole was made in the  femur and the distal cut was made 14 mm, sized for a #3.  Intramedullary  hole made in the tibia, where I inserted down the canal and an AP cut was  made.  The spacer block fit nicely, came out to full extension but no  hyperextension, and an additional 2 mm was removed, which gave excellent  balance.  Collateral ligaments were stable.  Chamfer cuts, box cuts were  made in the femur.  Sizing for a tibia with the #3 keel preparation was  performed, menisci were resected.  Posterior spurs were removed around the  femur.  There was no flexion contracture preoperatively.   Pulse lavage, drilling of the patella and trials insertion with 10 mm  bearing.  There was excellent range of motion, good patellar tracking,  stable and symmetrical medial and lateral balance in both flexion and  extension.  The tourniquet was not working well, it was dropped due to the  patient's obesity, extra large size, and with the tourniquet down there was  still some bleeding.  The wound was packed.  It was reinserted after lavage,  and cement was vacuum-mixed.  The tibia was cemented first, followed by the  femur, polyethylene insert and then cementing the  patella.  The cement was hard at 15 minutes.  All excess cement had been  removed after lavage again.  The true retinaculum was closed with Ethibond  #1, 2-0 Vicryl in the subcutaneous tissue, the superficial retinaculum, skin  staple closure, postop dressing with knee immobilizer, and transferred to  the recovery room.      Mark C. Ophelia Charter, M.D.  Electronically Signed     MCY/MEDQ  D:  08/03/2005  T:  08/03/2005  Job:  [id number]

## 2011-03-23 NOTE — Discharge Summary (Signed)
[facility]  Patient:    Karina [name], Karina [name]                     MRN: [id number] Adm. Date:  08/25/00 Disc. Date: 08/31/00 Attending:  Terald Sleeper Dictator:   Lorin Picket. Claiborne Billings, R.N. G.N.P. CC:         [facility]   Discharge Summary  CHIEF COMPLAINT:  Shortness of breath and wheezing for three weeks then chest pain.  HISTORY OF PRESENT ILLNESS:  A 50 year old female with known history of asthma since age 34, previously, under the care of Dr. Corinda Gubler.  She had since lost her job.  Was on steroids frequently for control of episodic exacerbations of her asthma.  Most recent exacerbation started three weeks ago with increased chest tightness and shortness of breath.  No cough, sputum, fever.  She came to the emergency room feeling a sensation of chest tightness "like an elephant standing on my chest"  Using Primatene Mist for her asthma.  PAST MEDICAL HISTORY: 1. Asthma. 2. Multiple allergies. 3. Nonsmoker. 4. Appendectomy with hysterectomy and cholecystectomy.  MEDICATIONS ON ADMISSION:  Primatene Mist.  SOCIAL HISTORY:  The patient is married.  REVIEW OF SYSTEMS:  See history of present illness for respiratory. CARDIOVASCULAR:  No actual chest pain but a sensation of pressure on the chest.  GI:  No nausea or vomiting.  Loose stools x 3.  PHYSICAL EXAMINATION:  On admission.  VITAL SIGNS:  Temperature 98.6, pulse 82, respirations 28, blood pressure 202/77.  HEENT:  Normal.  RESPIRATORY:  Decreased air entry and mild wheezing, left lower lung.  The patient is able to speak in full sentences.  CARDIOVASCULAR:  1/6 systolic ejection murmur over the aortic valve area.  ABDOMEN:  Obese with positive bowel sounds.  EXTREMITIES:  No edema or sign of DVT.  NEUROLOGIC:  The patient is alert and oriented.  HOSPITAL COURSE:  The patient was admitted for further evaluation.  Chest x-ray indicated cardiomegaly.  An EKG indicated  some ST depression in AVL, V5, V6.  CK 47, troponin I 0.01.  Metabolic panel:  Sodium 140, potassium 3.3, chloride 107, CO2 28, BUN 9, creatinine 0.6.  WBC was 11.8 and hemoglobin 11.2.  It was felt the patient was experiencing acute exacerbation of asthma thought to be mild.  Placed on nebulizers and rapid tapering dose of steroids. Unclear if chest tightness represented angina.  Admitted mainly to rule out MI.  Unsure if patient was experiencing sleep apnea.  Seen in consult by cardiology.  Recommended continuation of nitroglycerin drip initially and subcutaneous Lovenox.  Recommended serial cardiac enzymes.  Felt that if chest discomfort continued, would recommend further evaluation including possible cardiac catheterization.  Echocardiogram was done finding estimated ejection fraction 65-70%.  Technically limited study with normal aortic valve, normal mitral valve, normal overall left ventricular systolic function, normal left atrial size.  Could not comment on regional wall motion due to technical limitations.  The patients symptoms of asthma exacerbation improved rapidly with nebulizers and a steroid taper.  Began inhalation steroids.  Dobutamine Cardiolite study done August 28, 2000.  The patient stated again she felt as if "an elephant standing on my chest."  The Cardiolite infiltrated, and no images were obtained.  Cardiolite study repeated August 30, 2000.  Noted positive anteroapical ischemia.  Agreed to proceed with a cardiac catheterization the same day, August 30, 2000, with possible stent placement. Per catheterization, left ventricular ejection fraction estimated 50-60%.  LAD 20-30% circumflex and RCA within normal limits.  IMPRESSIONS:  No significant coronary artery disease with normal left ventricular function.  Presumed false-positive Cardiolite study and recommended a.m. discharge.  LABORATORY:  Blood gas room air August 28, 2000, pH 7.4, pCO2 high 48.1, pO2 low  at 72.5, bicarb high 29.2, total CO2 26.4.  Hemoglobin and hematocrit August 30, 2000, 11.7 and 33.8 respectively.  Full CBC September 27, 2000, WBC high at 17.8 on steroid taper, RBC low at 3.46, hemoglobin low at 9.9, hematocrit low at 28.0, RDW high at 14.5.  All other values normal. Coagulation studies August 29, 2000, PT 12.8, INR 1.0, PTT 23.  Metabolic panel August 31, 2000, CO2 high at 33, and all other values normal.  Repeat cardiac enzymes, CK 33, MB 0.3, troponin I 0.02.  Anemia study, August 28, 2000, iron 58, iron binding capacity 370, iron saturations 16, ferritin 52.  DISCHARGE MEDICATIONS: 1. Protonix 40 mg daily. 2. Zoloft 100 mg daily. 3. Albuterol nebulizer 2 puffs four times daily. 4. Flovent 2 puffs twice daily. 5. Lotensin/HCTZ 20/25 once daily.  DISCHARGE DIAGNOSES: 1. Acute exacerbation asthma, resolved. 2. Chest pain, probably noncardiac with roughly normal cardiac    catheterization, as above. 3. Probable sleep apnea. 4. Morbid obesity.  SPECIAL DISCHARGE INSTRUCTIONS:  Recommend visit with primary physician in approximately one month.  CONDITION ON DISCHARGE:  Stable.  DISCHARGE PROCESS:  Less than 30 minutes. DD:  09/25/00 TD:  09/27/00 Job: [id number]

## 2011-03-23 NOTE — Consult Note (Signed)
[facility]  Patient:    Karina [name], Karina [name]                     MRN: [id number] Proc. Date: 08/25/00 Adm. Date:  [id number] Attending:  Terald Sleeper                          Consultation Report  REASON FOR CONSULTATION:  Chest tightness in the setting of acute exacerbation.  PROBLEMS: 1. Acute asthma exacerbation. 2. Chest tightness/heaviness radiating to her right shoulder and occasionally    to back. 3. Abnormal ECG (see below). 4. History of anxiety syndrome. 5. Status post cholecystectomy and hysterectomy.  RECOMMENDATIONS: 1. Continue IV nitroglycerin drip at current level.  Increasing dose beyond    this would not provide any more therapeutic effect. 2. Will begin subcutaneous Lovenox 1 mg per kg q.12h. 3. Aspirin 325 mg given p.o. 4. Repeat CKMB and troponin x 2 q.8h. and ECG in a.m. 5. If chest discomfort persists and wheezing resolves and dyspnea improves,    then may need further cardiac evaluation.  Of course positive CKMB or    troponin would prompt need for cardiac catheterization.  FINDINGS:  The patient is a 50 year old woman who has been dealing with chronic asthma for many years.  She has recently not been seeing any physician, has been using over-the-counter Primatine mist "right much" even to walk.  Throughout the day today, she has had worsening dyspnea and chest discomfort of varyingly described as tightness or heaviness radiating to the shoulder and back.  An ECG was obtained by the nursing staff which showed slight ST segment depression with T-wave inversion in 1, 2, and aVL as well as V6.  She has not received much relief after two albuterol nebulizations and IV nitroglycerin now at 21 cc an hour which is 70 mcg per minute.  She relates no significant cardiac risk factors or previous cardiac history.  SOCIAL HISTORY:  She is accompanied by her husband in the emergency room this evening.  She works as Secondary school teacher at [employer]. She denies any drug abuse, specifically cocaine, no alcohol or tobacco usage. Caffeine intake is not excessive.  FAMILY HISTORY:  Not significant for early coronary disease.  Her father recently had two-vessel bypass in his early 39s.  REVIEW OF SYSTEMS:  Negative except as above.  PHYSICAL EXAMINATION:  VITAL SIGNS: Blood pressure 202/77, pulse 82, respiratory rate 28, temperature 98.6.  GENERAL:  The patient is an obese 50 year old woman sitting upright with mild respiratory distress, no nausea or diaphoresis.  HEENT:  Unremarkable.  The head is normocephalic and atraumatic.  Pupils equal, round and reactive to light and accommodation.  The sclerae are anicteric.  Extraocular movements were intact.  Oral mucosa is pink and moist. The tongue is not coated.  The neck is supple without thyromegaly or masses. Carotid upstrokes are normal.  There is no bruit.  There is no jugular venous distention.  CHEST:  Diffuse expiratory wheezing with adequate air movement.  No rales or rhonchi noted.  HEART:  Regular rhythm. A normal S1 and S2 is heard.  No S3, S4, murmur, click or rub noted.  PMI is not palpable.  ABDOMEN:  The abdomen is obese, soft and demonstrates epigastric tenderness. There is no guarding or rebound.  There are bowel sounds present in all quadrants.  GU:  The external genitalia are normal female without  lesions.  RECTAL:  Not performed.  EXTREMITIES:  Full range of motion, no edema and intact distal pulses.  NEUROLOGICAL:  Cranial nerves 2-12 were intact. Motor and sensory grossly intact.  Gait not tested.  SKIN:  Moist, clear and warm.  Chest x-ray shows "cardiac enlargement," otherwise clear.  ECG shows sinus rhythm with T-wave inversion and slight ST depression in 1, 2, AVL and V6. There is some ST elevation in V1 and V2.  Initial CKMB and troponin are negative thus far.  IMPRESSION: 1. Acute on top of chronic  asthma with bronchospasm and respiratory distress. 2. Chest "tightness" secondary to ? asthma, ? angina pectoris. 3. Abnormal ECG without prior tracing for comparison.  Notably no relief after    high dose nitroglycerin.  PLAN/RECOMMENDATION:  As above.  COMMENT:  It would seem most appropriate at this point to treat Mrs. Duer asthma aggressively and see whether her chest symptoms resolve.  If her ECG remains abnormal in the absence of symptoms, then either no further work-up or perhaps a noninvasive study such as exercise Cardiolite could be undertaken. If her chest discomfort persists as her asthma improves, then the likelihood of ongoing coronary ischemia becomes more feasible even though she has very few risk factors for this disorder at the age of 50.  In that setting, cardiac catheterization may be warranted.  Certainly any further objective signs of ischemia would be ominous and require invasive diagnostic modalities. DD:  08/25/00 TD:  08/26/00 Job: [id number]

## 2011-03-23 NOTE — Discharge Summary (Signed)
NAME:  Karina [name], Karina [name]                        ACCOUNT NO.:  [id number]   MEDICAL RECORD NO.:  [id number]                   PATIENT TYPE:  INP   LOCATION:  5006                                 FACILITY:  [facility]   PHYSICIAN:  Mark C. Ophelia Charter, M.D.                 DATE OF BIRTH:  [birth year]   DATE OF ADMISSION:  05/17/2004  DATE OF DISCHARGE:  05/22/2004                                 DISCHARGE SUMMARY   ADMISSION DIAGNOSIS:  Advanced left knee osteoarthritis.   BRIEF HISTORY:  Karina [name] is a 50 year old female with a history of bilateral  knee pain for over 20 years.  She had left knee arthroscopy approximately 15  years ago.  Since that time, the pain in both of her knees has progressively  worsened.  She is experiencing pain now in the left knee than the right.  This pain is constant pain secondary to activities of daily living.  X-rays  showed advanced end-stage osteoarthritis, mostly in the medial compartment  and patellofemoral compartments, also evidence of lateral osteophyte  formation.  Left total knee arthroplasty was recommended and agreed upon.   PROCEDURES:  The patient underwent a left total knee arthroplasty on May 17, 2004.  Surgeon: Veverly Fells. Ophelia Charter, M.D.  Assistant: Sandrea Matte, P.A.-C.  The patient tolerated the procedure well.  Please see operative note for  findings.   HOSPITAL COURSE:  The patient was admitted May 17, 2004, and underwent a  left total knee arthroplasty.  On May 18, 2004, postop day #1, labs were as  follows.  Hemoglobin 11.8, hematocrit 34.2.  INR 1.0.  The patient's Hemovac  drain was pulled.  She was complaining of moderate pain.  PCA morphine was  encouraged.  The patient was given 7.5 mg Coumadin today.  The patient got  out of bed to chair with physical therapy but could not tolerate standing up  today secondary to dizziness.   On May 19, 2004, the patient's labs were as follows.  Hemoglobin 9.9.  INR  1.2.  The patient's PCA and Dilaudid  were discontinued this day, and she was  switched to OxyContin 20 mg p.o. q.12h.  She started to develop confusion  and dizziness which was probably secondary to the Dilaudid.  The patient's  BUN today was 27, creatinine 3.7.  These labs were repeated.  Repeat labs  came back near the same range.  Renal service was consulted for possible  acute renal failure.  After consultation, it was ordered patient's ACE  inhibitor, Lisinopril, be stopped as well as HCTZ.  IV fluids were switched  to normal saline at 50 ml per hour.   With physical therapy, the patient stood at her bedside twice and did very  well with therapy.  On May 20, 2004, the patient's BUN and creatinine  continued to drift back to baseline.  IV fluids were decreased.  The  patient's hemoglobin  was 8.7, hematocrit 25.2.  BUN 19, creatinine 1.3.  Physical therapy was continued today.  The patient was given 6 mg Coumadin.   On May 21, 2004, labs were as follows.  Hemoglobin 8.2, hematocrit 24.2.  BUN 8, creatinine 0.8.  The patient's Foley was taken out today.  Her IV  fluids were discontinued.  She had not had a bowel movement yet and was  given a suppository.  Coumadin 6 mg was continued.  INR today is 1.9.   On May 22, 2004, the patient was evaluated by rehab.  The patient was not  interested in an inpatient rehab stay and wanted to go home with home health  PT when available.  Labs today, INR 2.0.  BUN 8, creatinine 0.7.  The  patient was started on iron sulfate b.i.d.  The patient's knee pain was much  improved today.  She is eating better.  She has had a bowel movement.  She  was urinating okay without the catheter.  She is discharged home with home  health PT and home CPM.   DISCHARGE MEDICATIONS:  1.  OxyContin 20 mg p.o. q.12h.  2.  Tylox 1 to 2 tablets p.o. q.4-6h. p.r.n.  3.  Coumadin 6 mg p.o. daily x 4 weeks.  4.  Iron sulfate 325 mg 1 tablet p.o. b.i.d. with meals.  5.  Resume home medications.   DISCHARGE  INSTRUCTIONS:  1.  The patient will follow up with Dr. Ophelia Charter one to two weeks      postoperatively.  2.  She will be getting staples out two weeks postoperatively.  3.  She will be getting home health PT at least three to four times a week      for the next four weeks as well as home health RN for INR and PT blood      draws.  4.  She will be going home with a rolling walker, a three-in-one commode,      and home CPM.   CONDITION ON DISCHARGE:  Stable.   DISPOSITION:  To home.   FINAL DIAGNOSES:  1.  Status post left total knee arthroplasty.  2.  Acute renal failure, resolved.      Sandrea Matte, P.A.                       Mark C. Ophelia Charter, M.D.    JH/MEDQ  D:  07/05/2004  T:  07/05/2004  Job:  [id number]

## 2011-03-23 NOTE — Op Note (Signed)
Karina [name], Karina [name]              ACCOUNT NO.:  [id number]   MEDICAL RECORD NO.:  [id number]          PATIENT TYPE:  INP   LOCATION:  2899                         FACILITY:  [facility]   PHYSICIAN:  Mark C. Ophelia Charter, M.D.    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   PROCEDURES:  Left foot posterior tibial nerve neurolysis (microscope  assisted); tarsal tunnel release.   SURGEON:  Mark C. Ophelia Charter, M.D.   ASSISTANT:  Wende Neighbors, P.A.-C.   ANESTHESIA:  GOT.   TOURNIQUET TIME:  1 hour 15 minutes.   BRIEF HISTORY:  This is a patient, who has had previous total knee  arthroplasty, and also a bunion on one side, who dropped a coffee cup, which  broke and lacerated hour right ankle just below the medial malleolus.  She  was treated at [facility], where the laceration was closed; and she  presented to my office a few weeks later with decreased sensation of the  medial plantar surface of her foot.  This has been a persistent finding.  Lateral plantar sensation is fairly normal, and the laceration was about 2.5  to 3 cm directly below the medial malleolus.  She is brought in at this time  for exploration and possible repair of the posterior tibial nerve.   DESCRIPTION OF PROCEDURES:  After induction of general anesthesia,  preoperative antibiotics, proximal thigh tourniquet application, standard  prepping and draping with DuraPrep, a sterile glove over the toes, after  stockinette had been applied with the usual extremity sheets and drapes, and  the stockinette was cut.  Sterile skin marker was used, outlining the old S-  shaped  incision, and then extension proximally and distally.  The posterior  tibial nerve was identified proximally and a 1/4-inch Penrose was placed  around it, and was followed from proximal to distal.  Distally, the  posterior tibial nerve was identified after splitting the adductor, both the  superficial and deep fascia.  There were multiple dilated veins and 1  calcaneal branch posterior tibial artery had to be divided for exposure.  The posterior tibial artery was lacerated from the previous injury, and  directly underneath this, the posterior tibial nerve was fixed in a mass of  scar tissue.  The nerve was followed from distal and proximal to the zone of  injury, and there was a mass of scar tissue, wherever the nerve did not have  any exposed ends.  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  Choices were resection of the nerve, and then repair.  The nerve was  carefully freed up, and since the epineurium was scarred in and the nerve  was complete in that it was round, the edges were together and there were no  lacerated ends.  The microscope had been draped and brought in and was used.  Epineurium was followed from proximal, as well as distal into the zone of  injury with the mass of scar tissue present in the zone.  With the nerve  neurolysed and the tarsal tunnel completely released, it was felt that  partial resection of the nerve and reanastomosis were not likely to get any  better result than leaving it as is.  A fingertip was introduced distally  down through the tarsal tunnel underneath the adductor and there were no  areas of compression.  Multiple dilated veins and small vein lacerations  from the time of injury were coagulated with the bipolar cautery.  The  tourniquet was released.  The operative field was dry.  The distal aspect of  the posterior tibial artery was not well visualized.  It was in scar tissue  and was not bleeding.  There were good capillary refill  to the toes, good  dorsalis pedis pulse, and the posterior tibial artery was pulsatile down to  the level just below the medial malleolus at the level of injury.  After  irrigation, with the operative field, a Hemovac was placed through a  separate stab incision.  Nylon 2-0 was used for closure of the skin.  A  postop dressing was applied, a soft dressing, with thick layers of 6-inch  Webril after Xeroform, 4 x 4's, Webril and then Ace wrap.  Instrument count  and needle count were correct.      Mark C. Ophelia Charter, M.D.  Electronically Signed     MCY/MEDQ  D:  08/19/2006  T:  08/19/2006  Job:  [id number]

## 2011-03-23 NOTE — H&P (Signed)
Karina [name], Karina [name]                        ACCOUNT NO.:  [id number]   MEDICAL RECORD NO.:  [id number]                   PATIENT TYPE:  INP   LOCATION:  2029                                 FACILITY:  [facility]   PHYSICIAN:  Nani Gasser, M.D.            DATE OF BIRTH:  Jul 12, 1961   DATE OF ADMISSION:  06/01/2002  DATE OF DISCHARGE:                                HISTORY & PHYSICAL   CHIEF COMPLAINT:  Chest pain for three weeks.  The patient was seen at 18:30  in the evening.   HISTORY OF PRESENT ILLNESS:  The patient is a 50 year-old white female with  a history of bipolar, obesity, hypertension and a positive family history of  vascular disease who presents to the ED with approximately three weeks of  shortness of breath and chest pain.  The patient says that she thought  originally it was her abdomen that was causing her shortness of breath and  chest pain and actually [facility]  The  patient was evidently treated as if she were having an asthma exacerbation.  The patient says that her chest pain and shortness of breath persisted and  she went back to her primary care physician approximately seven days ago and  had an EKG which at that time showed no abnormalities.  The patient  describes that her chest pain never really went away.  She described it as  an elephant sitting on the center of the chest, with a rating of a 7 out of  10 and pain that radiates to her left shoulder. She complains that her chest  pain and shortness of breath are worse with walking and with any type of  activity.  What brought her in today was that she said she was sitting at  her desk at work when she began to feel like she was floating.  She also  felt hot and clammy and had palpitations, so she went back to [facility] where he did a second EKG which showed some mild changes, so  she was sent to the ED here at [facility].  The patient did  receive  an aspirin at the outside office and the patient reports that she did take  her a.m. medications for her asthma.  The patient also complains of feeling  woozy intermittently.  She said that approximately a week ago she did have a  productive cough and some congestion and watery eyes.  All of these symptoms  have cleared though she still complains of now a dry cough.   PAST MEDICAL HISTORY:  1. History of bipolar.  2. Prior cardiac event in which she took too much, I believe, over the     counter Afrin and again having palpitations and chest pain back in 2000.     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  The patient was followed     by [facility] at that time.  3. Obesity.  4. Sinus surgery two years ago and two prior surgeries ten years ago.  5. Hypertension.  6. Asthma.  7. Acute depression.  8. Cholecystectomy eight years ago.  9. Appendectomy eight years ago.  10.      Hysterectomy seven years ago.  11.      Sleep apnea.   REVIEW OF SYMPTOMS:  GU:  The patient does complain of pain on the right  side while standing and urinating. She also complains of a milky discharge  from her vagina that is recent.  NEURO:  She does complain of some tingling  in her fingertips and lips that occurred today. ABDOMEN:  Negative for  abdominal pain.  GU:  No nausea or vomiting were reported.  PULMONARY:  No  current congestion.  No history of seizures.  CHEST:  Positive for frequent  palpitations.   ALLERGIES:  SULFA.   MEDICATIONS:  1. Accupril one half tablet p.o. q.d. of unknown dose.  2. Hydrochlorothiazide 25 mg p.o. q.d.  3. Albuterol nebulizers and Atrovent.  4. Prozac 80 mg p.o. q.d.  5. Wellbutrin 600 mg p.o. q.d.  6. Doxepin to help her sleep.   FAMILY HISTORY:  The patient's father had quadruple bypass  surgery at the  age of 17.  Her maternal grandfather died after a series of ministrokes in  his 52s and she says that pretty much everyone in her family is positive for  hypertension.   SOCIAL HISTORY:  She denies smoking, alcohol abuse or drug abuse.  The  patient is currently very stressed out.  She says that she lost her home  four months ago and is currently living with friends.  Her son is actually  staying with his father.   PHYSICAL EXAMINATION:  VITAL SIGNS:  Temperature 98.7, pulse 88,  respirations 17, blood pressure 102/61 and she is saturating 99% on room  air.  GENERAL:  She is a very pleasant, very obese lady who is working very hard  to breathe, only while she talks at rest.  The patient is much more  comfortable with her breathing.  HEENT:  Head is normocephalic, atraumatic.  External eyes and eyelids are  normal in appearance.  Conjunctivae are clear.  Sclerae are white.  Extraocular movements are intact.  Pupils equal, round and reactive to light  and accommodation.  The nose is without drainage. The oropharynx is clear  without exudate.  Teeth appear intact.  The neck is supple without  thyromegaly.  CHEST:  The patient has wheezes bilaterally on inspiration and expiration.  CARDIOVASCULAR:  Regular rate and rhythm.  No murmurs are heard.  Radial  pulses are 2+.  Pedal pulses are 2+.  No carotid bruits were heard.  ABDOMEN:  Positive bowel sounds, soft and tender to palpation in the right  upper quadrant and right lower quadrant to deep palpation.  EXTREMITIES:  No edema is present.  No venous stasis.  NEURO:  Cranial nerves II-XII are intact.  Gross motor is intact.  MUSCULOSKELETAL:  No atrophy is noted.   LABORATORY DATA:  CBC:  White count 10.6, hemoglobin 18.8, hematocrit 37.7,  platelets 350,000.  Sodium 139, potassium 3.9, chloride 100,  bicarbonate 30, BUN 10, creatinine 0.6, glucose 97.  AST 20, ALT 16, alkaline phosphatase  103 and total bilirubin 0.4.   CK was 50.  PT was 12.0, PTT 29.0.  Troponin  was less than 0.01.  EKG showed a rate of 99 with a normal axis and sinus rhythm.  There were  possibly some mild T wave inversions in 2, 3 and AVF.  This was a mild  change from May 14, 2001 which was an EKG taken at her primary care  physician's office.  Chest x-ray showed no acute disease.   ASSESSMENT AND PLAN:  The patient is a 50 year-old white female with  obesity, hypertension and a positive family history for cardiac disease.  1. Chest pain.  Her chest pain is concordant with typical angina. The     differential diagnoses include:     a. Myocardial infarction.  The patient does have three positive risk        factors - obesity, a positive family history and hypertension.  The        patient is also known to have a 30% blockage in one of her coronary        vessels at least a year ago.  The patient also has classic chest pain        complaints with shortness of breath and diaphoretic symptoms.  PLAN:        Her first set of cardiac enzymes is currently negative.  The plan is        to continue to get two more sets of enzymes eight hours apart as well        as go ahead and start her on a nitroglycerin drip and start heparin at        5,000 units with an IV bolus.  The patient will also be getting an        aspirin every day.  The patient's stools will be guaiac 'd before        starting heparin.     b. Pulmonary embolus.  This is less likely since the patient had a normal        chest x-ray and vital signs  are stable.  The patient has also had no        complaints of hemoptysis and there is no recent history of immobility        or recent surgery.  The patient also does not abuse tobacco.     c. Acute air obsession.  This is once again less likely because of a        normal chest x-ray and stable vital signs.     d. Pericarditis.  This is also less likely because the patient has no        friction rubs on exam and relief of the  chest pain is not postural.     e. Pneumothorax.  This is less likely because the patient has breath        sounds equal bilaterally and she is saturating 99% on room air.  In        addition, she has a normal chest x-ray.  2. Right sided upper and lower quadrant pain and tenderness.  This is less     likely to be pelvic inflammatory disease since the patient has had a     hysterectomy though the report of a yellow discharge is somewhat     concerning. We plan to do a wet  prep.  Without a uterus, it is less     likely the patient has GC of Chlamydia.  The patient has also had an     appendectomy in the past and this is not a consideration.  Diverticulosis     is a possibility, but the pain is out proportion.  The patient also has     had no fevers and no bloody diarrhea.  It is possible since the patient     still has ovaries that she might have an ovarian cyst. It is also    possible that it is also just muscle strain from coughing since she has     had a cough for the past couple of weeks.  3. Hypertension.  The patient's blood pressure on admission seems to be very     well controlled on her current hydrochlorothiazide diuretic and ACE     inhibitor.  We will continue these and continue to monitor her blood     pressure while she is here.  4. Asthma.  We will continue the patient's home Albuterol and Atrovent     nebulizers.  It is also possible that this may be somewhat contributing     to the patient's chest pain.  We will aggressively treat her asthma     during her stay here. If she continues to feel short of breath, we will     consider starting steroids and potentially starting an antibiotic in     addition.  5. Bipolar.  We will continue her Prozac and Wellbutrin. We will discuss     with the team tomorrow the patient's Wellbutrin dose.  It seems to be     very high for the patient only recently being started on the medication.  6. Obesity.  The patient needs encouragement and  counseling regarding the     consequences of being obese.  7. Hypokalemia. We will replace her potassium with K-Dur p.o.  8. Increased white count. This could be an acute phase reactant.  The     patient has had no fevers per her history.  We will get a CBC in the a.m.                                               Nani Gasser, M.D.    CM/MEDQ  D:  06/03/2002  T:  06/08/2002  Job:  [id number]

## 2011-03-23 NOTE — Cardiovascular Report (Signed)
NAMECHARYL, MINERVINI                        ACCOUNT NO.:  [id number]   MEDICAL RECORD NO.:  [id number]                   PATIENT TYPE:  INP   LOCATION:  4711                                 FACILITY:  [facility]   PHYSICIAN:  Eduardo Osier. Sharyn Lull, M.D.              DATE OF BIRTH:  03-Jun-1961   DATE OF PROCEDURE:  03/01/2003  DATE OF DISCHARGE:  03/01/2003                              CARDIAC CATHETERIZATION   PROCEDURE:  Left cardiac catheterization with selective left and right  coronary angiography, left ventriculography via right groin using Judkins  technique.   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  She came to the ER via EMS complaining of retrosternal  chest pressure, grade 9/10, associated with diaphoresis, nausea, and mild  shortness of breath, and tingling in both hands.  The patient received 3  sublingual nitroglycerin with partial relief, and now complains of mild  pleuritic chest pain.  Denies any cough, fevers, chills.  Denies hemoptysis.  Denies PND, orthopnea, or leg swelling.  Denies palpitations,  lightheadedness, or syncope.  The patient states she has been having chest  pain off and on for the last 2 days, but today the pain was worse, so  decided to come to ER.   PAST MEDICAL HISTORY:  She had cholecystectomy in the past, hysterectomy in  the past, appendectomy in the past, and nasal surgery in the past.   ALLERGIES:  She is allergic to SULFA.   MEDICATIONS:  At home, she is on Cardizem CD 240 mg p.o. q.12h., enteric-  coated aspirin 1 tab daily, Accupril 20 mg p.o. q.d., albuterol inhalers.   SOCIAL HISTORY:  She is married and has 1 son.  Smoked 1 pack per day for 5  to 6 years.  Quit 4  years ago.  She works for [employer].   FAMILY HISTORY:  Father is alive.  He had MI and subsequently had CABG.  He  is hypertensive.  Mother is alive.  She is diabetic, hypertensive.  One  brother had petit mal seizures.  One brother had attention deficit disorder.   PHYSICAL EXAMINATION:  GENERAL:  She is alert, awake, oriented x3, in no  acute distress.  VITAL SIGNS:  Blood pressure is 148/76.  Pulse was 72, regular.  HEENT:  Conjunctivae was pink.  NECK:  Supple, no JVD, no bruits.  LUNGS:  Clear to auscultation without rhonchi or rales.  CARDIOVASCULAR EXAM:  S1, S2 was normal.  There was a soft systolic murmur  and S4 gallop.  ABDOMEN:  Soft.  Bowel sounds were present.  Obese, nontender.  EXTREMITIES:  No cyanosis, clubbing, or edema.   LABORATORY DATA:  EKG done in the ER showed normal  sinus rhythm with ST  depression in inferolateral leads.  Two sets of cardiac enzymes were  negative.  Persantine Cardiolite done on Friday and Saturday, two __________  showed reversible ischemia in the anterolateral wall.  Due to multiple risk  factors and positive Persantine Cardiolite, discussed with patient and her  husband regarding left catheterization/possible PTCA stenting, its risks,  i.e., death, MI, stroke, need for emergency CABG, risk of restenosis, local  vascular complications, etc., and consented for PCI.   DESCRIPTION OF PROCEDURE:  After obtaining the informed consent, the patient  was brought to the catheterization lab and was placed on the fluoroscopy  table.  The right groin was prepped and draped in the usual fashion.  Xylocaine, 2%, was used for local anesthesia in the right groin.  With the  help of a thin-walled needle, a 6-French arterial sheath was placed.  The  sheath was aspirated and flushed.  Next, a 6-French left Judkins catheter  was advanced over the wire under fluoroscopic guidance up to the ascending  aorta.  The wire was pulled out.  The catheter was  aspirated and connected  to the manifold.  The catheter was further advanced and engaged into the  left coronary ostium.  Multiple views of the left system were taken.  Next,  the catheter was disengaged and was pulled out over the wire and was  replaced with a 6-French right Judkins catheter which was advanced over the  wire under fluoroscopic guidance up to the ascending aorta.  The wire was  pulled out.  The catheter was aspirated and connected to the manifold.  The  catheter was further advanced and engaged into the right coronary ostium.  Multiple views of the right system were taken.  Next, the catheter was  disengaged and was pulled out over the wire and was replaced with a 6-French  pigtail catheter which was advanced over the wire under fluoroscopic  guidance up to the ascending aorta.  The wire was pulled out.  The catheter  was aspirated and connected to the manifold.  The catheter was further  advanced across the aortic valve into the LV.  LV pressures were recorded.  Next, left ventriculography was done in 30-degree RAO position.  Post  angiographic pressures were recorded from the left ventricle, and then  pullback pressures were recorded from the aorta.  There was no significant  gradient across the aortic valve.  Next, the pigtail catheter was pulled out  over the wire.  Sheaths were aspirated and flushed.   FINDINGS:  1. The left ventricle showed good left ventricular systolic function,     moderate left ventricular hypertrophy, ejection fraction of 60% to 65%.  2. The left main was short which was patent.  3. The left anterior descending artery has 30% to 35% proximal focal     stenosis just distal to diagonal 1.  Diagonal 1 is medium-sized, which is     patent.  4. The left circumflex is patent.  The ramus is very small, which is patent.     Obtuse marginal 1 is very small, which is patent.  Obtuse marginal 2 is     large, which is patent. 5. The right coronary artery  is patent.  6. Arteriotomy was closed by Perclose at the end of the procedure without     complications.   The patient tolerated the procedure well.  The patient was transferred to  the recovery room in stable condition.  Eduardo Osier. Sharyn Lull, M.D.    MNH/MEDQ  D:  03/01/2003  T:  03/02/2003  Job:  [id number]   cc:   Cardiac Catheterization Lab

## 2011-03-23 NOTE — Consult Note (Signed)
[place]  Patient:    GIZZELLE, LACOMB                     MRN: [id number] Proc. Date: 02/04/00 Adm. Date:  [id number] Attending:  Carlean Purl Dictator:   [phone] CC:         Kristine Garbe. Ezzard Standing, M.D.                          Consultation Report  EMERGENCY ROOM  REASON FOR CONSULTATION: Evaluate patient with nasal stuffiness and trouble breathing.  HISTORY OF PRESENT ILLNESS: Pernella is a 50 year old female who had right-sided nasal surgery two days ago and has a small Merocel sinus pack placed in the right nasal passageway.  She complains of bilateral nasal stuffiness, thick mucus in he back of her throat.  She was having some trouble breathing.  PHYSICAL EXAMINATION: On examination she has no stridor.  Oral examination reveals some thick mucus postnasal drainage.  No significant swelling.  No real airway problems.  TREATMENT: I went ahead and removed the Merocel pack from the right side of the  nose and she has very diffuse swelling of the turbinate tissue, with complete obstruction of the right airway.  The left nasal passageway was clear.  IMPRESSION: Nasal congestion following surgery, with very swollen nasal membranes.  RECOMMENDATION: Recommended using saline nasal rinse as well as decongestant nasal spray to help reduce the swelling.  Will follow up in my office in two days for  recheck. DD:  02/04/00 TD:  02/04/00 Job: [id number]

## 2011-03-23 NOTE — Discharge Summary (Signed)
Karina [name], KORBER                        ACCOUNT NO.:  [id number]   MEDICAL RECORD NO.:  [id number]                   PATIENT TYPE:  INP   LOCATION:  4711                                 FACILITY:  [facility]   PHYSICIAN:  Eduardo Osier. Sharyn Lull, M.D.              DATE OF BIRTH:  [birth year]   DATE OF ADMISSION:  02/25/2003  DATE OF DISCHARGE:  03/01/2003                                 DISCHARGE SUMMARY   ADMISSION DIAGNOSES:  1. Chest pain; rule out coronary insufficiency.  2. Uncontrolled hypertension.  3. Morbid obesity.  4. Mild left ventricle outflow tract obstruction.  5. Positive family history of coronary artery disease.  6. History of bronchial asthma.  7. Arthritis.   DISCHARGE DIAGNOSES:  1. Mild coronary artery disease; positive Persantine/Cardiolite, status post     left catheterization.  2. Hypertension.  3. Morbid obesity.  4. Mild left ventricular outflow tract obstruction.  5. Left ventricular hypertrophy.  6. Positive family history of coronary artery disease.  7. History of bronchial asthma.  8. Arthritis.  9. History of depression.  10.      History of allergic rhinitis.   DISCHARGE MEDICATIONS:  1. Tiazac 240 mg, one capsule b.i.d.  2. Accupril 40 mg, one tablet daily.  3. Baby aspirin 81 mg, one tablet daily.  4. Protonix 40 mg, one tablet daily 1/2 hour a.c. breakfast.  5. Proventil inhaler two puffs q.8 h. as needed.   ACTIVITY:  Avoid heavy lifting, pushing, or pulling x48 hours.   DIET:  Low-salt, low-cholesterol weight-reducing diet.  The patient has been  advised to reduce weight.  The patient will be seen by Dr. Catha Gosselin prior to  discharge.   DISCHARGE INSTRUCTIONS:  Postcardiac catheterization and Perclose  instructions have been given.   FOLLOWUP:  Follow up with me in one week.   CONDITION ON DISCHARGE:  Stable.   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  She came to the ER via  EMS complaining of exercise chest pressure grade 9/10 associated with  diaphoresis, nausea, and mild shortness of breath and tingling in both  hands.  The patient received three sublingual nitroglycerin with partial  relief and now complains of mild pleuritic chest pain.  Denies any cough,  fever, chills.  Denies hemoptysis.  Denies PND, orthopnea, leg swelling.  Denies palpitations, lightheadedness, or syncope.  States has been having  chest pain off and on for the last two days but today the pain was worse, so  she decided to come to the ER.   PAST MEDICAL HISTORY:  As above.  The patient also had a  Persantine/Cardiolite in July 2003, which showed no evidence of ischemia  with an EF of 62%.  PAST SURGICAL HISTORY:  She had multiple surgeries in the past; i.e.  cholecystectomy, hysterectomy, appendectomy, and nasal surgery.   ALLERGIES:  She is allergic to SULFA.  She gets hives.   MEDICATIONS:  1. Cardizem 240 q.12 h.  2. Enteric-coated aspirin, one tab daily.  3. Accupril 20 mg, one tablet daily.  4. Albuterol inhaler.   SOCIAL HISTORY:  She is married and has one son.  Smoked one pack per day  for 5-6 years, quit four years ago.  Works for __________ [employer].   FAMILY HISTORY:  Father is alive, had MIs, as well as CABG.  He is  hypertensive.  Mother is alive.  She is diabetic, hypertensive.  One brother  had petite mal seizures.  One brother had attention deficit disorder.   PHYSICAL EXAMINATION:  GENERAL:  She is alert, awake, oriented x3, in no  acute distress.  VITAL SIGNS:  Blood pressure 148/76, pulse 72 and regular.  HEENT:  Conjunctivae pink.  NECK:  Supple.  No JVD, no bruit.  LUNGS:  Clear to auscultation without rhonchi, rales.  CARDIOVASCULAR:  S1, S2 were normal.  There was a soft systolic  murmur and  S4 gallop.  ABDOMEN:  Soft, obese.  Bowel sounds were present and nontender.  EXTREMITIES:  There was no clubbing, cyanosis, or edema.  Pedal pulses were  okay and there was no evidence of DVT.   LABORATORY DATA:  EKG showed normal sinus rhythm with ST depression in  inferolateral leads.   CK 75, MB 1.1; second set CK 36, MB 0.9; Troponin-I was 0.01 and second set  0.01.  Cholesterol 146, triglycerides 199, HDL 49, LDL 57.  CRP was elevated  31.10.  AST 32, ALT 12, ALP 94, total bilirubin slightly elevated 1.3,  lipase 25.  Sodium 139, potassium 3.9, chloride 103, bicarb 27, glucose 105,  BUN 10, creatinine 0.6.  Hemoglobin 11.8, hematocrit 34, white count 10.1.   HOSPITAL COURSE:  The patient was admitted to the telemetry unit.  MI was  ruled out by serial enzymes and EKG.  The patient underwent  Persantine/Cardiolite two day protocol which showed there was ischemia in  the anterolateral leads with normal ejection fraction.  Discussed with the  patient regarding Cardiolite finding and also left catheterization and  possible PTCA and stenting, possible need for emergency CABG, risk of  restenosis, focal vascular complications, etc. and consented for a PCI.   The patient successfully underwent left cardiac catheterization with  selective left and right coronary angiography today which showed mild  coronary artery disease.  The patient did not have any further episodes of  chest pain.  Her groin is stable.  There is no evidence of hematoma or  bruit.  The patient will be discharged home.  The patient is off heparin and  nitrates.  The patient will be discharged home on the above medications and  will be followed up in my office in one week.                                               Eduardo Osier. Sharyn Lull, M.D.    M[facility]/MEDQ  D:  03/01/2003  T:  03/02/2003  Job:  [id number]

## 2011-10-15 ENCOUNTER — Emergency Department: Payer: Self-pay

## 2011-10-31 ENCOUNTER — Emergency Department: Payer: Self-pay | Admitting: Emergency Medicine

## 2011-12-14 ENCOUNTER — Other Ambulatory Visit: Payer: Self-pay | Admitting: Orthopaedic Surgery

## 2011-12-14 DIAGNOSIS — M25572 Pain in left ankle and joints of left foot: Secondary | ICD-10-CM

## 2011-12-22 ENCOUNTER — Inpatient Hospital Stay: Admission: RE | Admit: 2011-12-22 | Payer: Self-pay | Source: Ambulatory Visit

## 2011-12-25 ENCOUNTER — Inpatient Hospital Stay: Admission: RE | Admit: 2011-12-25 | Payer: Self-pay | Source: Ambulatory Visit

## 2011-12-28 ENCOUNTER — Inpatient Hospital Stay: Admission: RE | Admit: 2011-12-28 | Payer: Self-pay | Source: Ambulatory Visit

## 2011-12-30 ENCOUNTER — Ambulatory Visit
Admission: RE | Admit: 2011-12-30 | Discharge: 2011-12-30 | Disposition: A | Discharge status: Home / Self Care | Payer: BC Managed Care – PPO | Source: Ambulatory Visit | Attending: Orthopaedic Surgery | Admitting: Orthopaedic Surgery

## 2011-12-30 DIAGNOSIS — M25572 Pain in left ankle and joints of left foot: Secondary | ICD-10-CM

## 2012-02-07 ENCOUNTER — Ambulatory Visit: Payer: Self-pay | Admitting: Bariatrics

## 2012-02-07 LAB — COMPREHENSIVE METABOLIC PANEL
Albumin: 3.5 g/dL (ref 3.4–5.0)
Alkaline Phosphatase: 101 U/L (ref 50–136)
Bilirubin,Total: 0.3 mg/dL (ref 0.2–1.0)
Chloride: 97 mmol/L — ABNORMAL LOW (ref 98–107)
EGFR (Non-African Amer.): >60
Glucose: 101 mg/dL — ABNORMAL HIGH (ref 65–99)
Osmolality: 276 (ref 275–301)
Potassium: 2.9 mmol/L — ABNORMAL LOW (ref 3.5–5.1)
SGOT(AST): 31 U/L (ref 15–37)
SGPT (ALT): 38 U/L
Total Protein: 7.4 g/dL (ref 6.4–8.2)

## 2012-02-07 LAB — CBC WITH DIFFERENTIAL/PLATELET
Basophil %: 0.4 %
Eosinophil #: 0.1 10*3/uL (ref 0.0–0.7)
HGB: 11.7 g/dL — ABNORMAL LOW (ref 12.0–16.0)
Lymphocyte #: 1.8 10*3/uL (ref 1.0–3.6)
Lymphocyte %: 19.8 %
MCHC: 32.9 g/dL (ref 32.0–36.0)
MCV: 84 fL (ref 80–100)
Monocyte %: 8.6 %
RBC: 4.24 10*6/uL (ref 3.80–5.20)
RDW: 15.8 % — ABNORMAL HIGH (ref 11.5–14.5)

## 2012-02-07 LAB — PROTIME-INR: INR: 1

## 2012-02-07 LAB — APTT: Activated PTT: 27.4 secs (ref 23.6–35.9)

## 2012-02-07 LAB — BILIRUBIN, DIRECT: Bilirubin, Direct: <0.1 mg/dL (ref 0.00–0.20)

## 2012-02-08 LAB — FOLATE: Folic Acid: 11.9 ng/mL (ref 3.1–100.0)

## 2012-02-12 LAB — IRON AND TIBC

## 2012-02-28 ENCOUNTER — Ambulatory Visit: Payer: Self-pay | Admitting: Internal Medicine

## 2012-02-28 ENCOUNTER — Ambulatory Visit: Payer: Self-pay | Admitting: Bariatrics

## 2012-03-05 ENCOUNTER — Ambulatory Visit: Payer: Self-pay | Admitting: Bariatrics

## 2012-03-06 ENCOUNTER — Other Ambulatory Visit: Payer: Self-pay | Admitting: Bariatrics

## 2012-03-06 LAB — COMPREHENSIVE METABOLIC PANEL
Albumin: 3.7 g/dL (ref 3.4–5.0)
Alkaline Phosphatase: 101 U/L (ref 50–136)
Anion Gap: 9 (ref 7–16)
Bilirubin,Total: 0.4 mg/dL (ref 0.2–1.0)
Chloride: 103 mmol/L (ref 98–107)
Creatinine: 0.8 mg/dL (ref 0.60–1.30)
Total Protein: 7.1 g/dL (ref 6.4–8.2)

## 2012-03-06 LAB — IRON AND TIBC
Iron Bind.Cap.(Total): 399 ug/dL (ref 250–450)
Iron Saturation: 20 %
Unbound Iron-Bind.Cap.: 331 ug/dL

## 2012-04-22 ENCOUNTER — Ambulatory Visit: Payer: Self-pay | Admitting: Bariatrics

## 2012-04-22 LAB — MAGNESIUM: Magnesium: 1.7 mg/dL — ABNORMAL LOW

## 2012-04-22 LAB — POTASSIUM: Potassium: 3.5 mmol/L (ref 3.5–5.1)

## 2012-04-29 ENCOUNTER — Inpatient Hospital Stay: Payer: Self-pay | Admitting: Bariatrics

## 2012-04-30 LAB — BASIC METABOLIC PANEL
Anion Gap: 8 (ref 7–16)
Calcium, Total: 8.4 mg/dL — ABNORMAL LOW (ref 8.5–10.1)
Chloride: 101 mmol/L (ref 98–107)
EGFR (Non-African Amer.): >60
Glucose: 97 mg/dL (ref 65–99)
Osmolality: 275 (ref 275–301)
Sodium: 138 mmol/L (ref 136–145)

## 2012-04-30 LAB — CBC WITH DIFFERENTIAL/PLATELET
Eosinophil %: 0 %
HCT: 34.4 % — ABNORMAL LOW (ref 35.0–47.0)
HGB: 11.1 g/dL — ABNORMAL LOW (ref 12.0–16.0)
Lymphocyte #: 1.5 10*3/uL (ref 1.0–3.6)
Lymphocyte %: 10.9 %
MCV: 84 fL (ref 80–100)
Neutrophil #: 11.5 10*3/uL — ABNORMAL HIGH (ref 1.4–6.5)
Platelet: 295 10*3/uL (ref 150–440)
RDW: 15.1 % — ABNORMAL HIGH (ref 11.5–14.5)
WBC: 13.9 10*3/uL — ABNORMAL HIGH (ref 3.6–11.0)

## 2012-04-30 LAB — PHOSPHORUS: Phosphorus: 3.1 mg/dL (ref 2.5–4.9)

## 2012-04-30 LAB — MAGNESIUM: Magnesium: 1.7 mg/dL — ABNORMAL LOW

## 2012-05-01 LAB — PATHOLOGY REPORT

## 2012-05-26 ENCOUNTER — Ambulatory Visit: Payer: Self-pay | Admitting: Bariatrics

## 2012-10-07 ENCOUNTER — Ambulatory Visit: Payer: Self-pay | Admitting: Internal Medicine

## 2013-06-15 ENCOUNTER — Other Ambulatory Visit (HOSPITAL_COMMUNITY): Payer: Self-pay | Admitting: Orthopaedic Surgery

## 2013-06-25 ENCOUNTER — Encounter (HOSPITAL_COMMUNITY): Payer: Self-pay | Admitting: Pharmacy Technician

## 2013-06-25 ENCOUNTER — Encounter (HOSPITAL_COMMUNITY)
Admission: RE | Admit: 2013-06-25 | Discharge: 2013-06-25 | Disposition: A | Discharge status: Home / Self Care | Payer: BC Managed Care – PPO | Source: Ambulatory Visit | Attending: Orthopaedic Surgery | Admitting: Orthopaedic Surgery

## 2013-06-25 ENCOUNTER — Encounter (HOSPITAL_COMMUNITY): Payer: Self-pay

## 2013-06-25 ENCOUNTER — Ambulatory Visit (HOSPITAL_COMMUNITY)
Admission: RE | Admit: 2013-06-25 | Discharge: 2013-06-25 | Disposition: A | Discharge status: Home / Self Care | Payer: BC Managed Care – PPO | Source: Ambulatory Visit | Attending: Orthopaedic Surgery | Admitting: Orthopaedic Surgery

## 2013-06-25 DIAGNOSIS — Z01810 Encounter for preprocedural cardiovascular examination: Secondary | ICD-10-CM | POA: Insufficient documentation

## 2013-06-25 DIAGNOSIS — Z01818 Encounter for other preprocedural examination: Secondary | ICD-10-CM | POA: Insufficient documentation

## 2013-06-25 DIAGNOSIS — M47812 Spondylosis without myelopathy or radiculopathy, cervical region: Secondary | ICD-10-CM | POA: Insufficient documentation

## 2013-06-25 DIAGNOSIS — Z01812 Encounter for preprocedural laboratory examination: Secondary | ICD-10-CM | POA: Insufficient documentation

## 2013-06-25 DIAGNOSIS — I517 Cardiomegaly: Secondary | ICD-10-CM | POA: Insufficient documentation

## 2013-06-25 HISTORY — DX: Nausea with vomiting, unspecified: Z98.890

## 2013-06-25 HISTORY — DX: Nausea with vomiting, unspecified: R11.2

## 2013-06-25 HISTORY — DX: Headache: R51

## 2013-06-25 HISTORY — DX: Unspecified asthma, uncomplicated: J45.909

## 2013-06-25 HISTORY — DX: Essential (primary) hypertension: I10

## 2013-06-25 HISTORY — DX: Sleep apnea, unspecified: G47.30

## 2013-06-25 LAB — COMPREHENSIVE METABOLIC PANEL
BUN: 14 mg/dL (ref 6–23)
Calcium: 9.6 mg/dL (ref 8.4–10.5)
GFR calc Af Amer: >90 mL/min (ref >90)
Glucose, Bld: 93 mg/dL (ref 70–99)
Total Protein: 7.1 g/dL (ref 6.0–8.3)

## 2013-06-25 LAB — PROTIME-INR
INR: 0.98 (ref 0.00–1.49)
Prothrombin Time: 12.8 seconds (ref 11.6–15.2)

## 2013-06-25 LAB — URINALYSIS, ROUTINE W REFLEX MICROSCOPIC
Leukocytes, UA: NEGATIVE
Protein, ur: NEGATIVE mg/dL
Urobilinogen, UA: 0.2 mg/dL (ref 0.0–1.0)

## 2013-06-25 LAB — CBC
HCT: 35.4 % — ABNORMAL LOW (ref 36.0–46.0)
Hemoglobin: 12.3 g/dL (ref 12.0–15.0)
MCH: 28.8 pg (ref 26.0–34.0)
MCHC: 34.7 g/dL (ref 30.0–36.0)

## 2013-06-25 LAB — SURGICAL PCR SCREEN: MRSA, PCR: NEGATIVE

## 2013-06-25 NOTE — Pre-Procedure Instructions (Signed)
Doria Fern Petter  06/25/2013   Your procedure is scheduled on:  August 27  Report to [facility] at 10 AM.  Call this number if you have problems the morning of surgery: [phone]   Remember:   Do not eat food or drink liquids after midnight.   Take these medicines the morning of surgery with A SIP OF WATER: budesonide-formoterol (SYMBICORT) 160-4.5 MCG/ACT inhaler,  FLUoxetine HCl 60 MG TABS, metoprolol succinate (TOPROL-XL) 50 MG 24 hr tablet, oxyCODONE-acetaminophen (PERCOCET)  10-325 MG per tabl   Do not wear jewelry, make-up or nail polish.  Do not wear lotions, powders, or perfumes. You may wear deodorant.  Do not shave 48 hours prior to surgery. Men may shave face and neck.  Do not bring valuables to the hospital.  [facility] is not responsible  for any belongings or valuables.  Contacts, dentures or bridgework may not be worn into surgery.  Leave suitcase in the car. After surgery it may be brought to your room.  For patients admitted to the hospital, checkout time is 11:00 AM the day of  discharge.   Patients discharged the day of surgery will not be allowed to drive  home.  Name and phone number of your driver:   Special Instructions: Shower using CHG 2 nights before surgery and the night before surgery.  If you shower the day of surgery use CHG.  Use special wash - you have one bottle of CHG for all showers.  You should use approximately 1/3 of the bottle for each shower.   Please read over the following fact sheets that you were given: Pain Booklet, Coughing and Deep Breathing and Surgical Site Infection Prevention

## 2013-06-26 ENCOUNTER — Encounter (HOSPITAL_COMMUNITY): Payer: Self-pay

## 2013-06-26 NOTE — Progress Notes (Signed)
Anesthesia Chart Review:  Patient is a 52 year old female scheduled for C5-6 ACDF on 07/01/13 by Dr. Ophelia Charter.  History includes former smoker, HTN, OSA, asthma, headaches, post-operative N/V, hysterectomy, obesity, laparoscopic gastric sleeve resection, cholecystectomy, appendectomy, bilateral TKR, tonsillectomy.  She saw cardiologist D.r Adrian Blackwater at [facility] in 2012 and a cardiac cath showing no significant CAD with normal EF.   EKG on 06/25/13 showed SB @ 57 bpm, possible LAE, non-specific ST/T wave abnormality.  Echo on 02/14/12 showed technically difficult study due to body habitus, moderately dilated LA with rest of the chambers of normal size, normal LV systolic function, normal wall motion, mild LVH, mild MR/TR, mild pulmonary HTN.  Nuclear stress test on 01/03/11 showed mild apical fixed defect felt due to breast attenuation with no evidence of ischemia, normal EF 70%.  However, she subsequently underwent cardiac cath on 01/17/11 that showed only minor irregularities, EF 60%.  CXR on 06/25/13 showed no active disease.  Cardiomegaly again noted.  Preoperative labs noted.  Anticipate that she can proceed as planned.  Velna Ochs [facility]/Anesthesiology Phone [phone] 06/26/2013 12:24 PM

## 2013-06-30 MED ORDER — DEXTROSE 5 % IV SOLN
3.0000 g | INTRAVENOUS | Status: AC
  Administered 2013-07-01: 3 g via INTRAVENOUS
  Filled 2013-06-30: qty 3000

## 2013-06-30 NOTE — H&P (Signed)
[facility]   A Division of [facility]   [address] [place], [place] [address] Telephone: [phone]  Fax: [phone]     PATIENT: Karina [name], Karina [name]   MR#: [id number]  DOB: Sep 21, 1961       A 52 year old female returns with persistent and ongoing neck pain that has been going on more than 3 months with persistent symptoms that radiated into the right side of her neck and down her arm into the middle of her hand.  She describes it as a drilling pain.  She states it is constant.  She has difficulty with sleeping.  She has not been able to work.  She cannot get any relief.  She has been through chiropractic therapy for 5 weeks.  She has used electrical stimulation, anti-inflammatories, narcotic pain medication, gradually increasing dosages.  MRI scan shows spondylosis at C6-7 with right paracentral disk protrusion.  She has no evidence of myelopathy.  She has been treated with muscle relaxants, Zanaflex, meloxicam, Robaxin, prednisone dose pack, Vicodin, and now is on Percocet 10, gradually increasing narcotic usage due to her increasing pain.  She has mild canal narrowing or foraminal narrowing on scan.  Her family history, social history, medications are reviewed and updated.     CURRENT MEDICATIONS:   She is on Prozac 20 mg daily, metoprolol 25 mg daily, Symbicort 2 puffs daily, and Percocet 10/325, 5 a day and she states she has run out.     PAST SURGICAL HISTORY:   She has had previous knee surgery in 2005 and 2006, gastric sleeve procedure in 2013.  Rotator cuff repair 2011.  C-section, hysterectomy in the distal past.  Foot surgery in 2008.   FAMILY HISTORY:   Positive for diabetes, heart disease, lung disease, colon cancer, hypertension.   SOCIAL HISTORY:   The patient is married to her husband Les Pou.  She does not smoke.  She does not drink.  She works for a [employer].   REVIEW OF SYSTEMS:   Positive for acid reflux, anxiety,  arthritis, asthma, cataracts, bronchitis, hypertension, previous gastric sleeve procedure, sleep apnea and migraines.     PHYSICAL EXAMINATION:  The patient is 5 feet 6 inches.  Weight 294.  Extraocular movements intact.  She has brachial plexus tenderness on the right.  No accessory muscle inspiratory effort.  No expiratory wheezing.  The patient has no liver and spleen palpable enlargement.  Lower extremity reflexes are 2+.  No myelopathic changes.  Decreased sensation in the index and long finger of her right hand, normal in the left.  Carpal tunnel exam is negative.  Positive Spurling on the right, negative on the left.  Negative Lhermitte.  Romberg sign is negative.  No lower extremity hyperreflexia.   ASSESSMENT: C6-7 cervical spondylosis symptomatic. Right C6-7 paracentral disc herniation     PLAN: Anterior cervical discectomy and fusion C6-7 with allograft,plate and screws.   We discussed options including epidural.  She states she is extremely afraid of considering epidural.  She states she would prefer to proceed with operative intervention.  Her symptoms have been present for 4 months.  She has had numerous treatments and most recently was acupuncture treatment without relief.  We discussed options.  The plan would be single level cervical fusion C6-7, anterior approach with allograft and plate fixation, overnight stay in the past.  Soft collar and she understands she would be out of work for 6 weeks after the surgery.  If someone could drive her  to work, she could go earlier.  She states a lot of work she can do from home.  Risks of surgery including dysphasia, dysphonia, pseudoarthrosis and procedure discussed.  She understands and request we proceed.  Norco 10/325, 1 p.o. q.6 p.r.n., 40 tablets prescribed.     For additional information please see handwritten notes, reports, orders and prescriptions in this chart.      Mark C. Ophelia Charter, M.D.    Auto-Authenticated by Veverly Fells. Ophelia Charter,  M.D.

## 2013-07-01 ENCOUNTER — Encounter (HOSPITAL_COMMUNITY): Payer: Self-pay | Admitting: Vascular Surgery

## 2013-07-01 ENCOUNTER — Ambulatory Visit (HOSPITAL_COMMUNITY): Payer: BC Managed Care – PPO

## 2013-07-01 ENCOUNTER — Encounter (HOSPITAL_COMMUNITY): Payer: Self-pay | Admitting: *Deleted

## 2013-07-01 ENCOUNTER — Inpatient Hospital Stay (HOSPITAL_COMMUNITY)
Admission: RE | Admit: 2013-07-01 | Discharge: 2013-07-02 | DRG: 756 | Disposition: A | Discharge status: Home / Self Care | Payer: BC Managed Care – PPO | Source: Ambulatory Visit | Attending: Orthopaedic Surgery | Admitting: Orthopaedic Surgery

## 2013-07-01 ENCOUNTER — Ambulatory Visit (HOSPITAL_COMMUNITY): Payer: BC Managed Care – PPO | Admitting: Anesthesiology

## 2013-07-01 ENCOUNTER — Encounter (HOSPITAL_COMMUNITY)
Admission: RE | Disposition: A | Discharge status: Home / Self Care | Payer: Self-pay | Source: Ambulatory Visit | Attending: Orthopaedic Surgery

## 2013-07-01 DIAGNOSIS — Z9884 Bariatric surgery status: Secondary | ICD-10-CM

## 2013-07-01 DIAGNOSIS — M5020 Other cervical disc displacement, unspecified cervical region: Secondary | ICD-10-CM | POA: Diagnosis present

## 2013-07-01 DIAGNOSIS — F411 Generalized anxiety disorder: Secondary | ICD-10-CM | POA: Diagnosis present

## 2013-07-01 DIAGNOSIS — G4730 Sleep apnea, unspecified: Secondary | ICD-10-CM | POA: Diagnosis present

## 2013-07-01 DIAGNOSIS — K219 Gastro-esophageal reflux disease without esophagitis: Secondary | ICD-10-CM | POA: Diagnosis present

## 2013-07-01 DIAGNOSIS — M129 Arthropathy, unspecified: Secondary | ICD-10-CM | POA: Diagnosis present

## 2013-07-01 DIAGNOSIS — E669 Obesity, unspecified: Secondary | ICD-10-CM | POA: Diagnosis present

## 2013-07-01 DIAGNOSIS — IMO0002 Reserved for concepts with insufficient information to code with codable children: Secondary | ICD-10-CM | POA: Diagnosis present

## 2013-07-01 DIAGNOSIS — I10 Essential (primary) hypertension: Secondary | ICD-10-CM | POA: Diagnosis present

## 2013-07-01 DIAGNOSIS — J45909 Unspecified asthma, uncomplicated: Secondary | ICD-10-CM | POA: Diagnosis present

## 2013-07-01 DIAGNOSIS — G43909 Migraine, unspecified, not intractable, without status migrainosus: Secondary | ICD-10-CM | POA: Diagnosis present

## 2013-07-01 DIAGNOSIS — M47812 Spondylosis without myelopathy or radiculopathy, cervical region: Principal | ICD-10-CM | POA: Diagnosis present

## 2013-07-01 HISTORY — PX: ANTERIOR CERVICAL DECOMP/DISCECTOMY FUSION: SHX1161

## 2013-07-01 SURGERY — ANTERIOR CERVICAL DECOMPRESSION/DISCECTOMY FUSION 1 LEVEL
Anesthesia: General | Site: Neck | Wound class: Clean

## 2013-07-01 MED ORDER — DOCUSATE SODIUM 100 MG PO CAPS
100.0000 mg | ORAL_CAPSULE | Freq: Two times a day (BID) | ORAL | Status: DC
  Administered 2013-07-01 – 2013-07-02 (×2): 100 mg via ORAL
  Filled 2013-07-01 (×3): qty 1

## 2013-07-01 MED ORDER — ACETAMINOPHEN 650 MG RE SUPP: 650.0000 mg | RECTAL | Status: DC | PRN

## 2013-07-01 MED ORDER — LIDOCAINE HCL (CARDIAC) 20 MG/ML IV SOLN
INTRAVENOUS | Status: DC | PRN
  Administered 2013-07-01: 50 mg via INTRAVENOUS

## 2013-07-01 MED ORDER — BUDESONIDE-FORMOTEROL FUMARATE 160-4.5 MCG/ACT IN AERO
2.0000 | INHALATION_SPRAY | Freq: Two times a day (BID) | RESPIRATORY_TRACT | Status: DC
  Administered 2013-07-01 – 2013-07-02 (×2): 2 via RESPIRATORY_TRACT
  Filled 2013-07-01: qty 6

## 2013-07-01 MED ORDER — SODIUM CHLORIDE 0.9 % IJ SOLN: 3.0000 mL | INTRAMUSCULAR | Status: DC | PRN

## 2013-07-01 MED ORDER — PHENOL 1.4 % MT LIQD: 1.0000 | OROMUCOSAL | Status: DC | PRN

## 2013-07-01 MED ORDER — METHOCARBAMOL 500 MG PO TABS: 500.0000 mg | ORAL_TABLET | Freq: Four times a day (QID) | ORAL | Status: AC | PRN

## 2013-07-01 MED ORDER — PROPOFOL 10 MG/ML IV BOLUS
INTRAVENOUS | Status: DC | PRN
  Administered 2013-07-01: 150 mg via INTRAVENOUS

## 2013-07-01 MED ORDER — FLEET ENEMA 7-19 GM/118ML RE ENEM: 1.0000 | ENEMA | Freq: Once | RECTAL | Status: AC | PRN

## 2013-07-01 MED ORDER — GLYCOPYRROLATE 0.2 MG/ML IJ SOLN
INTRAMUSCULAR | Status: DC | PRN
  Administered 2013-07-01: .6 mg via INTRAVENOUS

## 2013-07-01 MED ORDER — FLUOXETINE HCL 20 MG PO CAPS
60.0000 mg | ORAL_CAPSULE | Freq: Every day | ORAL | Status: DC
  Administered 2013-07-02: 60 mg via ORAL
  Filled 2013-07-01: qty 3

## 2013-07-01 MED ORDER — ACETAMINOPHEN 325 MG PO TABS: 650.0000 mg | ORAL_TABLET | ORAL | Status: DC | PRN

## 2013-07-01 MED ORDER — ADULT MULTIVITAMIN W/MINERALS CH
1.0000 | ORAL_TABLET | Freq: Every day | ORAL | Status: DC
  Administered 2013-07-01 – 2013-07-02 (×2): 1 via ORAL
  Filled 2013-07-01 (×2): qty 1

## 2013-07-01 MED ORDER — KCL IN DEXTROSE-NACL 20-5-0.45 MEQ/L-%-% IV SOLN
INTRAVENOUS | Status: DC
  Administered 2013-07-01: 19:00:00 via INTRAVENOUS
  Filled 2013-07-01 (×3): qty 1000

## 2013-07-01 MED ORDER — BUPIVACAINE-EPINEPHRINE 0.5% -1:200000 IJ SOLN
INTRAMUSCULAR | Status: DC | PRN
  Administered 2013-07-01: 3 mL

## 2013-07-01 MED ORDER — LACTATED RINGERS IV SOLN
INTRAVENOUS | Status: DC
  Administered 2013-07-01: 11:00:00 via INTRAVENOUS

## 2013-07-01 MED ORDER — LOSARTAN POTASSIUM-HCTZ 50-12.5 MG PO TABS: 1.0000 | ORAL_TABLET | Freq: Every day | ORAL | Status: DC

## 2013-07-01 MED ORDER — METHOCARBAMOL 100 MG/ML IJ SOLN: 500.0000 mg | Freq: Four times a day (QID) | INTRAMUSCULAR | Status: DC | PRN

## 2013-07-01 MED ORDER — FLUOXETINE HCL 60 MG PO TABS: 60.0000 mg | ORAL_TABLET | Freq: Every day | ORAL | Status: DC

## 2013-07-01 MED ORDER — OXYCODONE-ACETAMINOPHEN 5-325 MG PO TABS: 1.0000 | ORAL_TABLET | ORAL | Status: AC | PRN

## 2013-07-01 MED ORDER — 0.9 % SODIUM CHLORIDE (POUR BTL) OPTIME
TOPICAL | Status: DC | PRN
  Administered 2013-07-01: 1000 mL

## 2013-07-01 MED ORDER — ROCURONIUM BROMIDE 100 MG/10ML IV SOLN
INTRAVENOUS | Status: DC | PRN
  Administered 2013-07-01: 50 mg via INTRAVENOUS
  Administered 2013-07-01: 10 mg via INTRAVENOUS

## 2013-07-01 MED ORDER — ONDANSETRON HCL 4 MG/2ML IJ SOLN: 4.0000 mg | Freq: Once | INTRAMUSCULAR | Status: DC | PRN

## 2013-07-01 MED ORDER — HYDROMORPHONE HCL PF 1 MG/ML IJ SOLN
0.2500 mg | INTRAMUSCULAR | Status: DC | PRN
  Administered 2013-07-01 (×3): 0.5 mg via INTRAVENOUS

## 2013-07-01 MED ORDER — HYDROMORPHONE HCL PF 1 MG/ML IJ SOLN
0.5000 mg | Freq: Four times a day (QID) | INTRAMUSCULAR | Status: DC | PRN
  Administered 2013-07-01: 0.5 mg via INTRAVENOUS
  Filled 2013-07-01: qty 1

## 2013-07-01 MED ORDER — BISACODYL 10 MG RE SUPP: 10.0000 mg | Freq: Every day | RECTAL | Status: DC | PRN

## 2013-07-01 MED ORDER — OXYCODONE HCL 5 MG PO TABS: 5.0000 mg | ORAL_TABLET | Freq: Once | ORAL | Status: DC | PRN

## 2013-07-01 MED ORDER — METHOCARBAMOL 500 MG PO TABS
500.0000 mg | ORAL_TABLET | Freq: Four times a day (QID) | ORAL | Status: DC | PRN
Start: 2013-07-01 — End: 2013-07-02
  Administered 2013-07-01: 500 mg via ORAL
  Filled 2013-07-01: qty 1

## 2013-07-01 MED ORDER — PANTOPRAZOLE SODIUM 40 MG IV SOLR
40.0000 mg | Freq: Every day | INTRAVENOUS | Status: DC
  Administered 2013-07-01: 40 mg via INTRAVENOUS
  Filled 2013-07-01 (×2): qty 40

## 2013-07-01 MED ORDER — NEOSTIGMINE METHYLSULFATE 1 MG/ML IJ SOLN
INTRAMUSCULAR | Status: DC | PRN
  Administered 2013-07-01: 5 mg via INTRAVENOUS

## 2013-07-01 MED ORDER — ONDANSETRON HCL 4 MG/2ML IJ SOLN
4.0000 mg | INTRAMUSCULAR | Status: DC | PRN
  Administered 2013-07-01 – 2013-07-02 (×3): 4 mg via INTRAVENOUS
  Filled 2013-07-01 (×3): qty 2

## 2013-07-01 MED ORDER — FLUTICASONE PROPIONATE 50 MCG/ACT NA SUSP
2.0000 | Freq: Every day | NASAL | Status: DC
  Administered 2013-07-01: 2 via NASAL
  Filled 2013-07-01: qty 16

## 2013-07-01 MED ORDER — HYDROCHLOROTHIAZIDE 12.5 MG PO CAPS
12.5000 mg | ORAL_CAPSULE | Freq: Every day | ORAL | Status: DC
  Administered 2013-07-01 – 2013-07-02 (×2): 12.5 mg via ORAL
  Filled 2013-07-01 (×2): qty 1

## 2013-07-01 MED ORDER — METOPROLOL SUCCINATE ER 50 MG PO TB24
50.0000 mg | ORAL_TABLET | Freq: Every day | ORAL | Status: DC
  Administered 2013-07-02: 50 mg via ORAL
  Filled 2013-07-01: qty 1

## 2013-07-01 MED ORDER — LACTATED RINGERS IV SOLN
INTRAVENOUS | Status: DC | PRN
  Administered 2013-07-01 (×2): via INTRAVENOUS

## 2013-07-01 MED ORDER — CEFAZOLIN SODIUM 1-5 GM-% IV SOLN
1.0000 g | Freq: Three times a day (TID) | INTRAVENOUS | Status: DC
  Administered 2013-07-02: 1 g via INTRAVENOUS
  Filled 2013-07-01 (×2): qty 50

## 2013-07-01 MED ORDER — MIDAZOLAM HCL 5 MG/5ML IJ SOLN
INTRAMUSCULAR | Status: DC | PRN
  Administered 2013-07-01: 2 mg via INTRAVENOUS

## 2013-07-01 MED ORDER — BUPIVACAINE-EPINEPHRINE (PF) 0.5% -1:200000 IJ SOLN
INTRAMUSCULAR | Status: AC
  Filled 2013-07-01: qty 10

## 2013-07-01 MED ORDER — HYDROCODONE-ACETAMINOPHEN 5-325 MG PO TABS: 1.0000 | ORAL_TABLET | ORAL | Status: DC | PRN

## 2013-07-01 MED ORDER — HYDROMORPHONE HCL PF 1 MG/ML IJ SOLN
INTRAMUSCULAR | Status: AC
  Filled 2013-07-01: qty 1

## 2013-07-01 MED ORDER — MENTHOL 3 MG MT LOZG: 1.0000 | LOZENGE | OROMUCOSAL | Status: DC | PRN

## 2013-07-01 MED ORDER — ONDANSETRON HCL 4 MG/2ML IJ SOLN
INTRAMUSCULAR | Status: DC | PRN
  Administered 2013-07-01: 4 mg via INTRAVENOUS

## 2013-07-01 MED ORDER — METOCLOPRAMIDE HCL 5 MG/ML IJ SOLN
10.0000 mg | Freq: Four times a day (QID) | INTRAMUSCULAR | Status: DC
  Administered 2013-07-01: 10 mg via INTRAVENOUS
  Filled 2013-07-01: qty 2

## 2013-07-01 MED ORDER — SODIUM CHLORIDE 0.9 % IV SOLN: 250.0000 mL | INTRAVENOUS | Status: DC

## 2013-07-01 MED ORDER — SENNA-DOCUSATE SODIUM 8.6-50 MG PO TABS: 3.0000 | ORAL_TABLET | Freq: Every day | ORAL | Status: DC

## 2013-07-01 MED ORDER — SODIUM CHLORIDE 0.9 % IJ SOLN: 3.0000 mL | Freq: Two times a day (BID) | INTRAMUSCULAR | Status: DC

## 2013-07-01 MED ORDER — OXYCODONE-ACETAMINOPHEN 5-325 MG PO TABS
1.0000 | ORAL_TABLET | ORAL | Status: DC | PRN
  Administered 2013-07-01 – 2013-07-02 (×4): 2 via ORAL
  Filled 2013-07-01 (×4): qty 2

## 2013-07-01 MED ORDER — FENTANYL CITRATE 0.05 MG/ML IJ SOLN
INTRAMUSCULAR | Status: DC | PRN
  Administered 2013-07-01 (×2): 100 ug via INTRAVENOUS
  Administered 2013-07-01: 50 ug via INTRAVENOUS
  Administered 2013-07-01: 100 ug via INTRAVENOUS
  Administered 2013-07-01: 150 ug via INTRAVENOUS

## 2013-07-01 MED ORDER — HYDROMORPHONE HCL PF 10 MG/ML IJ SOLN: 0.5000 mg/h | Freq: Four times a day (QID) | INTRAMUSCULAR | Status: DC | PRN

## 2013-07-01 MED ORDER — LABETALOL HCL 5 MG/ML IV SOLN
INTRAVENOUS | Status: DC | PRN
  Administered 2013-07-01 (×2): 5 mg via INTRAVENOUS

## 2013-07-01 MED ORDER — METOCLOPRAMIDE HCL 5 MG/ML IJ SOLN
10.0000 mg | Freq: Four times a day (QID) | INTRAMUSCULAR | Status: DC | PRN
  Administered 2013-07-02: 10 mg via INTRAVENOUS
  Filled 2013-07-01: qty 2

## 2013-07-01 MED ORDER — OXYCODONE HCL 5 MG/5ML PO SOLN: 5.0000 mg | Freq: Once | ORAL | Status: DC | PRN

## 2013-07-01 MED ORDER — SENNA-DOCUSATE SODIUM 8.6-50 MG PO TABS
3.0000 | ORAL_TABLET | Freq: Every day | ORAL | Status: DC
  Administered 2013-07-01: 3 via ORAL
  Filled 2013-07-01 (×2): qty 3

## 2013-07-01 MED ORDER — LOSARTAN POTASSIUM 50 MG PO TABS
50.0000 mg | ORAL_TABLET | Freq: Every day | ORAL | Status: DC
  Administered 2013-07-01 – 2013-07-02 (×2): 50 mg via ORAL
  Filled 2013-07-01 (×2): qty 1

## 2013-07-01 SURGICAL SUPPLY — 65 items
ADH SKN CLS APL DERMABOND .7 (GAUZE/BANDAGES/DRESSINGS) ×1
APL SKNCLS STERI-STRIP NONHPOA (GAUZE/BANDAGES/DRESSINGS) ×1
BENZOIN TINCTURE PRP APPL 2/3 (GAUZE/BANDAGES/DRESSINGS) ×1 IMPLANT
BIT DRILL SKYLINE 12MM (BIT) IMPLANT
BLADE SURG ROTATE 9660 (MISCELLANEOUS) IMPLANT
BONE CERV LORDOTIC 14.5X12X7 (Bone Implant) ×2 IMPLANT
BUR ROUND FLUTED 4 SOFT TCH (BURR) IMPLANT
CLOTH BEACON ORANGE TIMEOUT ST (SAFETY) ×2 IMPLANT
CLSR STERI-STRIP ANTIMIC 1/2X4 (GAUZE/BANDAGES/DRESSINGS) ×1 IMPLANT
COLLAR CERV LO CONTOUR FIRM DE (SOFTGOODS) ×2 IMPLANT
CORDS BIPOLAR (ELECTRODE) IMPLANT
COVER MAYO STAND STRL (DRAPES) ×2 IMPLANT
COVER SURGICAL LIGHT HANDLE (MISCELLANEOUS) ×2 IMPLANT
DERMABOND ADVANCED (GAUZE/BANDAGES/DRESSINGS) ×1
DERMABOND ADVANCED .7 DNX12 (GAUZE/BANDAGES/DRESSINGS) ×1 IMPLANT
DRAPE C-ARM 42X72 X-RAY (DRAPES) ×2 IMPLANT
DRAPE INCISE IOBAN 66X45 STRL (DRAPES) ×1 IMPLANT
DRAPE MICROSCOPE LEICA (MISCELLANEOUS) ×2 IMPLANT
DRAPE PROXIMA HALF (DRAPES) ×2 IMPLANT
DRILL BIT SKYLINE 12MM (BIT) ×2
DRSG MEPILEX BORDER 4X4 (GAUZE/BANDAGES/DRESSINGS) ×2 IMPLANT
DRSG MEPILEX BORDER 4X8 (GAUZE/BANDAGES/DRESSINGS) ×2 IMPLANT
DURAPREP 6ML APPLICATOR 50/CS (WOUND CARE) ×2 IMPLANT
ELECT COATED BLADE 2.86 ST (ELECTRODE) ×2 IMPLANT
ELECT REM PT RETURN 9FT ADLT (ELECTROSURGICAL) ×2
ELECTRODE REM PT RTRN 9FT ADLT (ELECTROSURGICAL) ×1 IMPLANT
EVACUATOR 1/8 PVC DRAIN (DRAIN) ×2 IMPLANT
GAUZE XEROFORM 1X8 LF (GAUZE/BANDAGES/DRESSINGS) ×4 IMPLANT
GLOVE BIOGEL PI IND STRL 7.0 (GLOVE) IMPLANT
GLOVE BIOGEL PI IND STRL 7.5 (GLOVE) ×1 IMPLANT
GLOVE BIOGEL PI IND STRL 8 (GLOVE) ×1 IMPLANT
GLOVE BIOGEL PI INDICATOR 7.0 (GLOVE) ×1
GLOVE BIOGEL PI INDICATOR 7.5 (GLOVE) ×1
GLOVE BIOGEL PI INDICATOR 8 (GLOVE) ×1
GLOVE ECLIPSE 7.0 STRL STRAW (GLOVE) ×2 IMPLANT
GLOVE ORTHO TXT STRL SZ7.5 (GLOVE) ×2 IMPLANT
GLOVE SURG SS PI 7.0 STRL IVOR (GLOVE) ×1 IMPLANT
GOWN PREVENTION PLUS LG XLONG (DISPOSABLE) IMPLANT
GOWN PREVENTION PLUS XLARGE (GOWN DISPOSABLE) ×2 IMPLANT
GOWN STRL NON-REIN LRG LVL3 (GOWN DISPOSABLE) ×5 IMPLANT
GRAFT BNE SPCR VG2 14.5X12X7 (Bone Implant) IMPLANT
HEAD HALTER (SOFTGOODS) ×2 IMPLANT
HEMOSTAT SURGICEL 2X14 (HEMOSTASIS) IMPLANT
KIT BASIN OR (CUSTOM PROCEDURE TRAY) ×2 IMPLANT
KIT ROOM TURNOVER OR (KITS) ×2 IMPLANT
MANIFOLD NEPTUNE II (INSTRUMENTS) ×2 IMPLANT
NDL 25GX 5/8IN NON SAFETY (NEEDLE) ×1 IMPLANT
NEEDLE 25GX 5/8IN NON SAFETY (NEEDLE) ×2 IMPLANT
NS IRRIG 1000ML POUR BTL (IV SOLUTION) ×2 IMPLANT
PACK ORTHO CERVICAL (CUSTOM PROCEDURE TRAY) ×2 IMPLANT
PAD ARMBOARD 7.5X6 YLW CONV (MISCELLANEOUS) ×4 IMPLANT
PATTIES SURGICAL .5 X.5 (GAUZE/BANDAGES/DRESSINGS) IMPLANT
PIN FIXATION SKYLINE (Pin) ×1 IMPLANT
PLATE SKYLINE 12MM (Plate) ×1 IMPLANT
SCREW VARIABLE SELF TAP 12MM (Screw) ×4 IMPLANT
SPONGE GAUZE 4X4 12PLY (GAUZE/BANDAGES/DRESSINGS) ×2 IMPLANT
SURGIFLO TRUKIT (HEMOSTASIS) IMPLANT
SUT VIC AB 3-0 X1 27 (SUTURE) ×2 IMPLANT
SUT VICRYL 4-0 PS2 18IN ABS (SUTURE) ×4 IMPLANT
SYR 30ML SLIP (SYRINGE) ×2 IMPLANT
SYR BULB 3OZ (MISCELLANEOUS) ×2 IMPLANT
TAPE CLOTH SURG 4X10 WHT LF (GAUZE/BANDAGES/DRESSINGS) ×1 IMPLANT
TOWEL OR 17X24 6PK STRL BLUE (TOWEL DISPOSABLE) ×2 IMPLANT
TOWEL OR 17X26 10 PK STRL BLUE (TOWEL DISPOSABLE) ×2 IMPLANT
WATER STERILE IRR 1000ML POUR (IV SOLUTION) ×2 IMPLANT

## 2013-07-01 NOTE — Discharge Instructions (Signed)
No lifting greater than 10 lbs. No overhead use of arms. °Avoid bending,and twisting neck. °Walk in house for first week them may start to get out slowly increasing distance up to one mile by 3 weeks post op. °Keep incision dry for 3 days, may then bathe and wet incision using a covered collar when showering. °Call if any fevers >101, chills, or increasing numbness or weakness or increased swelling or drainage. ° °

## 2013-07-01 NOTE — Interval H&P Note (Signed)
History and Physical Interval Note:  07/01/2013 11:50 AM  Karina [name]  has presented today for surgery, with the diagnosis of Right C6-7 Spondylosis, Disc Protrusion  The various methods of treatment have been discussed with the patient and family. After consideration of risks, benefits and other options for treatment, the patient has consented to  Procedure(s) with comments: ANTERIOR CERVICAL DECOMPRESSION/DISCECTOMY FUSION 1 LEVEL (N/A) - C6-7 Anterior Cervical Discectomy and Fusion, Allograft, Plate as a surgical intervention .  The patient's history has been reviewed, patient examined, no change in status, stable for surgery.  I have reviewed the patient's chart and labs.  Questions were answered to the patient's satisfaction.     Claramae Rigdon C

## 2013-07-01 NOTE — Anesthesia Procedure Notes (Signed)
Procedure Name: Intubation Date/Time: 07/01/2013 1:22 PM Performed by: Coralee Rud Pre-anesthesia Checklist: Patient identified, Emergency Drugs available, Suction available and Patient being monitored Patient Re-evaluated:Patient Re-evaluated prior to inductionOxygen Delivery Method: Circle system utilized Preoxygenation: Pre-oxygenation with 100% oxygen Intubation Type: IV induction Ventilation: Mask ventilation without difficulty and Oral airway inserted - appropriate to patient size Laryngoscope Size: Miller and 3 Grade View: Grade I Tube type: Oral Tube size: 7.5 mm Number of attempts: 1 Airway Equipment and Method: Stylet Placement Confirmation: ETT inserted through vocal cords under direct vision,  positive ETCO2 and breath sounds checked- equal and bilateral Secured at: 22 cm Tube secured with: Tape Dental Injury: Teeth and Oropharynx as per pre-operative assessment

## 2013-07-01 NOTE — Brief Op Note (Signed)
07/01/2013  2:51 PM  PATIENT:  Karina [name]  52 y.o. female  PRE-OPERATIVE DIAGNOSIS:  Right C6-7 Spondylosis, Disc Protrusion  POST-OPERATIVE DIAGNOSIS:  Right C6-7 Spondylosis, Disc Protrusion  PROCEDURE:  Procedure(s) with comments: ANTERIOR CERVICAL DECOMPRESSION/DISCECTOMY FUSION 1 LEVEL (N/A) - C6-7 Anterior Cervical Discectomy and Fusion, Allograft, Plate  SURGEON:  Surgeon(s) and Role:    * Eldred Manges, MD - Primary  PHYSICIAN ASSISTANT: Maud Deed PAC  ASSISTANTS: none   ANESTHESIA:   general  EBL:  Total I/O In: 1000 [I.V.:1000] Out: -   BLOOD ADMINISTERED:none  DRAINS: none   LOCAL MEDICATIONS USED:  NONE  SPECIMEN:  No Specimen  DISPOSITION OF SPECIMEN:  N/A  COUNTS:  YES  TOURNIQUET:  * No tourniquets in log *  DICTATION: .Note written in EPIC  PLAN OF CARE: Admit to inpatient   PATIENT DISPOSITION:  PACU - hemodynamically stable.   Delay start of Pharmacological VTE agent (>24hrs) due to surgical blood loss or risk of bleeding: yes

## 2013-07-01 NOTE — Transfer of Care (Signed)
Immediate Anesthesia Transfer of Care Note  Patient: SAHORY NORDLING  Procedure(s) Performed: Procedure(s) with comments: ANTERIOR CERVICAL DECOMPRESSION/DISCECTOMY FUSION 1 LEVEL (N/A) - C6-7 Anterior Cervical Discectomy and Fusion, Allograft, Plate  Patient Location: PACU  Anesthesia Type:General  Level of Consciousness: awake, alert , oriented and patient cooperative  Airway & Oxygen Therapy: Patient Spontanous Breathing and Patient connected to nasal cannula oxygen  Post-op Assessment: Report given to PACU RN, Post -op Vital signs reviewed and stable and Patient moving all extremities  Post vital signs: Reviewed and stable  Complications: No apparent anesthesia complications

## 2013-07-01 NOTE — Preoperative (Signed)
Beta Blockers   Reason not to administer Beta Blockers:Not Applicable, Pt took Metoprolol at 0800 today 

## 2013-07-01 NOTE — Anesthesia Preprocedure Evaluation (Signed)
Anesthesia Evaluation  Patient identified by MRN, date of birth, ID band Patient awake    Reviewed: Allergy & Precautions, H&P , NPO status , Patient's Chart, lab work & pertinent test results, reviewed documented beta blocker date and time   Airway Mallampati: II      Dental  (+) Teeth Intact and Dental Advisory Given,    Pulmonary  breath sounds clear to auscultation        Cardiovascular Rhythm:Regular Rate:Normal     Neuro/Psych    GI/Hepatic   Endo/Other    Renal/GU      Musculoskeletal   Abdominal (+) + obese,   Peds  Hematology   Anesthesia Other Findings   Reproductive/Obstetrics                           Anesthesia Physical Anesthesia Plan  ASA: III  Anesthesia Plan: General   Post-op Pain Management:    Induction: Intravenous  Airway Management Planned: Oral ETT  Additional Equipment:   Intra-op Plan:   Post-operative Plan: Extubation in OR  Informed Consent: I have reviewed the patients History and Physical, chart, labs and discussed the procedure including the risks, benefits and alternatives for the proposed anesthesia with the patient or authorized representative who has indicated his/her understanding and acceptance.   Dental advisory given  Plan Discussed with: CRNA and Anesthesiologist  Anesthesia Plan Comments: (HNP C6-7 Htn Obesity S/P gastric sleeve 04/2012 lost 110 lbs since Loose R. Upper tooth Asthma lungs clear  H/O sleep apnea, symptoms resolved with weight loss   Plan GA with oral ETT  Kipp Brood )        Anesthesia Quick Evaluation

## 2013-07-02 MED ORDER — PROMETHAZINE HCL 25 MG/ML IJ SOLN
12.5000 mg | Freq: Four times a day (QID) | INTRAMUSCULAR | Status: DC | PRN
  Administered 2013-07-02: 25 mg via INTRAVENOUS
  Filled 2013-07-02: qty 1

## 2013-07-02 NOTE — Progress Notes (Signed)
Subjective: 1 Day Post-Op Procedure(s) (LRB): ANTERIOR CERVICAL DECOMPRESSION/DISCECTOMY FUSION 1 LEVEL (N/A) Patient reports pain as mild.  Neck pain at incision. No remaining right arm pain that she had pre-op.    Objective: Vital signs in last 24 hours: Temp:  [97.6 F (36.4 C)-98.7 F (37.1 C)] 98.7 F (37.1 C) (08/28 0600) Pulse Rate:  [59-87] 80 (08/28 0600) Resp:  [12-20] 16 (08/28 0600) BP: (148-196)/(67-94) 148/67 mmHg (08/28 0600) SpO2:  [92 %-100 %] 97 % (08/28 0600)  Intake/Output from previous day: 08/27 0701 - 08/28 0700 In: 2435 [P.O.:360; I.V.:2075] Out: 0  Intake/Output this shift:    No results found for this basename: HGB,  in the last 72 hours No results found for this basename: WBC, RBC, HCT, PLT,  in the last 72 hours No results found for this basename: NA, K, CL, CO2, BUN, CREATININE, GLUCOSE, CALCIUM,  in the last 72 hours No results found for this basename: LABPT, INR,  in the last 72 hours  Neurologically intact  Assessment/Plan: 1 Day Post-Op Procedure(s) (LRB): ANTERIOR CERVICAL DECOMPRESSION/DISCECTOMY FUSION 1 LEVEL (N/A) D/C Home  . Office one week  Jarquavious Fentress C 07/02/2013, 8:25 AM

## 2013-07-02 NOTE — Op Note (Signed)
Karina [name], GREENLY NO.:  [id number]  MEDICAL RECORD NO.:  [id number]  LOCATION:  5N22C                        FACILITY:  [facility]  PHYSICIAN:  Karina [name], M.D.    DATE OF BIRTH:  08-06-1961  DATE OF PROCEDURE:  07/01/2013 DATE OF DISCHARGE:                              OPERATIVE REPORT   PREOPERATIVE DIAGNOSIS:  Cervical spondylosis, C6-7.  POSTOPERATIVE DIAGNOSIS:  Cervical spondylosis, C6-7.  PROCEDURE:  C6-7 anterior cervical diskectomy and fusion, allograft and plate.  SURGEON:  Karina [name], M.D.  ANESTHESIA:  General.  Neck local 4 mL Marcaine, skin local used at end of the case.  ASSISTANT:  Maud Deed, PA-C, medically necessary and present for the entire procedure.  ESTIMATED BLOOD LOSS:  Minimal.  DRAINS:  1 Hemovac.  INDICATIONS:  This 52 year old female has had persistent neck, shoulder pain with cervical spondylosis, flaps in the disk space, uncovertebral changes, and foraminal narrowing on the right corresponding with her symptoms, failed conservative treatment, with increased symptoms times greater than 4 months.  Pain is present to a point where she has been unable to work, having trouble sleeping, not respond to physical therapy, Medrol Dosepak, epidural steroids.  DESCRIPTION OF PROCEDURE:  After induction of general anesthesia, orotracheal intubation, standard prepping of the neck with head halter traction already applied.  Arms were tucked at the sides.  Tape was applied to the chest.  Due to the patient's body habitus.  Wrist restraints were applied for application and traction on the arms in order to improve visualization.  After DuraPrep was dried, the area was squared with towels, sterile skin was marker used on the prominent fold. Betadine, Steri-Drape, sterile Mayo stand at the head, and thyroid sheets and drapes.  Time-out procedure was completed.  Ancef was given prophylactically.  PCR was negative.  Incision was  made at the midline extending the left platysma, which was divided in line with the fibers.  Blunt dissection with carotid sheath and contents lateral.  Soft distant trachea medially and palpation of the prominent spur at C6-7 levels were performed.  Carotid tubercle was palpable and due to the patient's short neck and obesity, a needle was placed at 25 short in the C5-6 level and then multiple series of fluoroscopic pictures were taken; AP, lateral, oblique, variable obliques, obliques with head halter traction applied, and obliques with arm pulled on traction applied on wrist restraints.  Images were sent to Radiology initially.  Radiologist said that they were at the C6-7 level. With obliques it was apparent that this was C5-6 level and I asked the radiologist downstairs to look at them again and reviewed them again and then they agreed.  I send down plain radiograph images that were in the operating room.  For comparison additional images were taken and then they have agreed that the needle was at C5-6, one level above planned cervical surgery level which was C6-7.  Spurs were removed at C6-7 level.  It was marked, taking chunks out of the disk.  Disk space was completely collapsed, corresponded with preoperative imaging.  Operative microscope was draped, brought in, and uncovertebral joints were stripped using Cloward curettes.  Posterior longitudinal ligaments  were taken down using the microscope.  Visualization decompression of the dura was performed and there was a large chunk of disk midway back on dissection back to the posterior longitudinal ligament that came from the right side, which was causing significant disk protrusion on the right corresponding with her symptoms.  Posterior and longitudinal was completely taken down.  There was no extruded fragments.  Dura was visualized, was round, decompressed.  Trial sizes showed that a 7 mm graft gave excellent fit.  Cortical  cancellous graft was opened, marked anteriorly, inserted with CRNA applying head halter traction.  Graft was countersunk 2-mm, plate was selected, checked on fluoroscopy and then four 12-mm screws were placed, locked, and confirmed under C-arm, irrigated.  Hemovac was placed in and out technique in line with the skin incision. Platysma 4-0 Vicryl subcuticular closure was performed, and tincture, benzoin, Steri-Strips, Marcaine infiltration, postop dressing.  Instrument count and needle count was correct.     Karina [name], M.D.     [facility]/MEDQ  D:  07/01/2013  T:  07/02/2013  Job:  [id number]

## 2013-07-02 NOTE — Progress Notes (Signed)
Utilization review completed. Micaela Stith, RN, BSN. 

## 2013-07-03 ENCOUNTER — Encounter (HOSPITAL_COMMUNITY): Payer: Self-pay | Admitting: Orthopaedic Surgery

## 2013-07-03 NOTE — Discharge Summary (Signed)
Physician Discharge Summary  Patient ID: Karina [name] MRN: [id number] DOB/AGE: 53/23/1962 53 y.o.  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 PONV (postoperative nausea and vomiting)   . Hypertension   . Asthma   . Headache(784.0)     occassional cluster migraine headaches  . Sleep apnea     Surgeries: Procedure(s): ANTERIOR CERVICAL DECOMPRESSION/DISCECTOMY FUSION 1 LEVEL C6-7 on 07/01/2013   Consultants (if any):  none  Discharged Condition: Improved  Hospital Course: Karina [name] is an 52 y.o. female who was admitted 07/01/2013 with a diagnosis of HNP (herniated nucleus pulposus), cervical and went to the operating room on 07/01/2013 and underwent the above named procedures.    She was given perioperative antibiotics:  Anti-infectives   Start     Dose/Rate Route Frequency Ordered Stop   07/01/13 1830  ceFAZolin (ANCEF) IVPB 1 g/50 mL premix  Status:  Discontinued     1 g 100 mL/hr over 30 Minutes Intravenous Every 8 hours 07/01/13 1743 07/02/13 1326   07/01/13 0600  ceFAZolin (ANCEF) 3 g in dextrose 5 % 50 mL IVPB     3 g 160 mL/hr over 30 Minutes Intravenous On call to O.R. 06/30/13 1427 07/01/13 1325    .  She was given sequential compression devices, early ambulation for DVT prophylaxis.  She benefited maximally from the hospital stay and there were no complications.    Recent vital signs:  Filed Vitals:   07/02/13 0600  BP: 148/67  Pulse: 80  Temp: 98.7 F (37.1 C)  Resp: 16    Recent laboratory studies:  Lab Results  Component Value Date   HGB 12.3 06/25/2013   HGB 11.7* 08/25/2010   Lab Results  Component Value Date   WBC 8.3 06/25/2013   PLT 295 06/25/2013   Lab Results  Component  Value Date   INR 0.98 06/25/2013   Lab Results  Component Value Date   NA 139 06/25/2013   K 3.3* 06/25/2013   CL 99 06/25/2013   CO2 30 06/25/2013   BUN 14 06/25/2013   CREATININE 0.73 06/25/2013   GLUCOSE 93 06/25/2013    Discharge Medications:     Medication List    STOP taking these medications       oxyCODONE-acetaminophen 10-325 MG per tablet  Commonly known as:  PERCOCET  Replaced by:  oxyCODONE-acetaminophen 5-325 MG per tablet      TAKE these medications       budesonide-formoterol 160-4.5 MCG/ACT inhaler  Commonly known as:  SYMBICORT  Inhale 2 puffs into the lungs 2 (two) times daily.     FLUoxetine HCl 60 MG Tabs  Take 60 mg by mouth daily.     fluticasone 50 MCG/ACT nasal spray  Commonly known as:  FLONASE  Place 2 sprays into the nose at bedtime.     losartan-hydrochlorothiazide 50-12.5 MG per tablet  Commonly known as:  HYZAAR  Take 1 tablet by mouth daily.     meloxicam 7.5 MG tablet  Commonly known as:  MOBIC  Take 7.5 mg by mouth 2 (two) times daily as needed for pain.     methocarbamol 500 MG tablet  Commonly known as:  ROBAXIN  Take 1 tablet (500 mg total) by mouth every 6 (six) hours as needed (spasm).  metoprolol succinate 50 MG 24 hr tablet  Commonly known as:  TOPROL-XL  Take 50 mg by mouth daily. Take with or immediately following a meal.     multivitamin with minerals Tabs tablet  Take 1 tablet by mouth daily.     oxyCODONE-acetaminophen 5-325 MG per tablet  Commonly known as:  ROXICET  Take 1-2 tablets by mouth every 4 (four) hours as needed for pain.     sennosides-docusate sodium 8.6-50 MG tablet  Commonly known as:  SENOKOT-S  Take 3 tablets by mouth at bedtime.        Diagnostic Studies: Dg Chest 2 View  06/25/2013   *RADIOLOGY REPORT*  Clinical Data: Preop for cervical spondylosis  CHEST - 2 VIEW  Comparison: 08/25/2010  Findings: Cardiomegaly again noted.  No acute infiltrate or pleural effusion.  No pulmonary edema.   Stable degenerative changes mid and lower thoracic spine.  IMPRESSION: No active disease.  Cardiomegaly again noted.   Original Report Authenticated By: Natasha Mead, M.D.   Dg Cervical Spine 2-3 Views  07/01/2013   *RADIOLOGY REPORT*  Clinical Data: Cervical fusion  CERVICAL SPINE - 2-3 VIEW,DG C-ARM 1-60 MIN  Comparison: None available  Findings: Three fluoroscopic spot images document anterior fusion hardware placement C6-7.  Cervicothoracic junction is not well seen.  IMPRESSION:  ACDF C6-7.   Original Report Authenticated By: D. Andria Rhein, MD   Dg Cervical Spine Complete  07/01/2013   *RADIOLOGY REPORT*  Clinical Data: Localization film.  Patient for discectomy.  CERVICAL SPINE - COMPLETE 4+ VIEW  Comparison: None.  Findings: Four fluoroscopic intraoperative spot views of the cervical spine are provided.  Although the study is limited by the patient's body habitus, metallic probe is identified at the C5-6 level.  IMPRESSION: As above.   Original Report Authenticated By: Holley Dexter, M.D.   Dg C-arm 1-60 Min  07/01/2013   *RADIOLOGY REPORT*  Clinical Data: Cervical fusion  CERVICAL SPINE - 2-3 VIEW,DG C-ARM 1-60 MIN  Comparison: None available  Findings: Three fluoroscopic spot images document anterior fusion hardware placement C6-7.  Cervicothoracic junction is not well seen.  IMPRESSION:  ACDF C6-7.   Original Report Authenticated By: D. Andria Rhein, MD    Disposition: 01-Home or Self Care No lifting greater than 10 lbs. No overhead use of arms. Avoid bending,and twisting neck. Walk in house for first week them may start to get out slowly increasing distance up to one mile by 3 weeks post op. Keep incision dry for 3 days, may then bathe and wet incision using a covered collar when showering. Call if any fevers >101, chills, or increasing numbness or weakness or increased swelling or drainage.        Follow-up Information   Follow up with Eldred Manges, MD. Schedule an appointment as  soon as possible for a visit in 2 weeks.   Specialty:  Orthopedic Surgery   Contact information:   [address] [place] [address] [phone]        Signed: Wende Neighbors 07/03/2013, 4:15 PM

## 2013-07-06 NOTE — Anesthesia Postprocedure Evaluation (Signed)
Anesthesia Post Note  Patient: Karina [name]  Procedure(s) Performed: Procedure(s) (LRB): ANTERIOR CERVICAL DECOMPRESSION/DISCECTOMY FUSION 1 LEVEL (N/A)  Anesthesia type: General  Patient location: PACU  Post pain: Pain level controlled and Adequate analgesia  Post assessment: Post-op Vital signs reviewed, Patient's Cardiovascular Status Stable, Respiratory Function Stable, Patent Airway and Pain level controlled  Last Vitals:  Filed Vitals:   07/02/13 0600  BP: 148/67  Pulse: 80  Temp: 37.1 C  Resp: 16    Post vital signs: Reviewed and stable  Level of consciousness: awake, alert  and oriented  Complications: No apparent anesthesia complications

## 2013-07-31 ENCOUNTER — Other Ambulatory Visit: Payer: Self-pay | Admitting: Bariatrics

## 2013-07-31 DIAGNOSIS — J45909 Unspecified asthma, uncomplicated: Secondary | ICD-10-CM

## 2013-07-31 DIAGNOSIS — I10 Essential (primary) hypertension: Secondary | ICD-10-CM

## 2013-07-31 DIAGNOSIS — R1310 Dysphagia, unspecified: Secondary | ICD-10-CM

## 2013-08-13 ENCOUNTER — Other Ambulatory Visit: Payer: BC Managed Care – PPO

## 2013-08-16 ENCOUNTER — Ambulatory Visit: Payer: Self-pay | Admitting: Internal Medicine

## 2013-08-24 IMAGING — CR DG CHEST 2V
2 series · 2 of 2 positions shown · non-contrast
Comparison: 08/25/2010

CLINICAL DATA: Preop for cervical spondylosis

CHEST - 2 VIEW

[w chest pa]
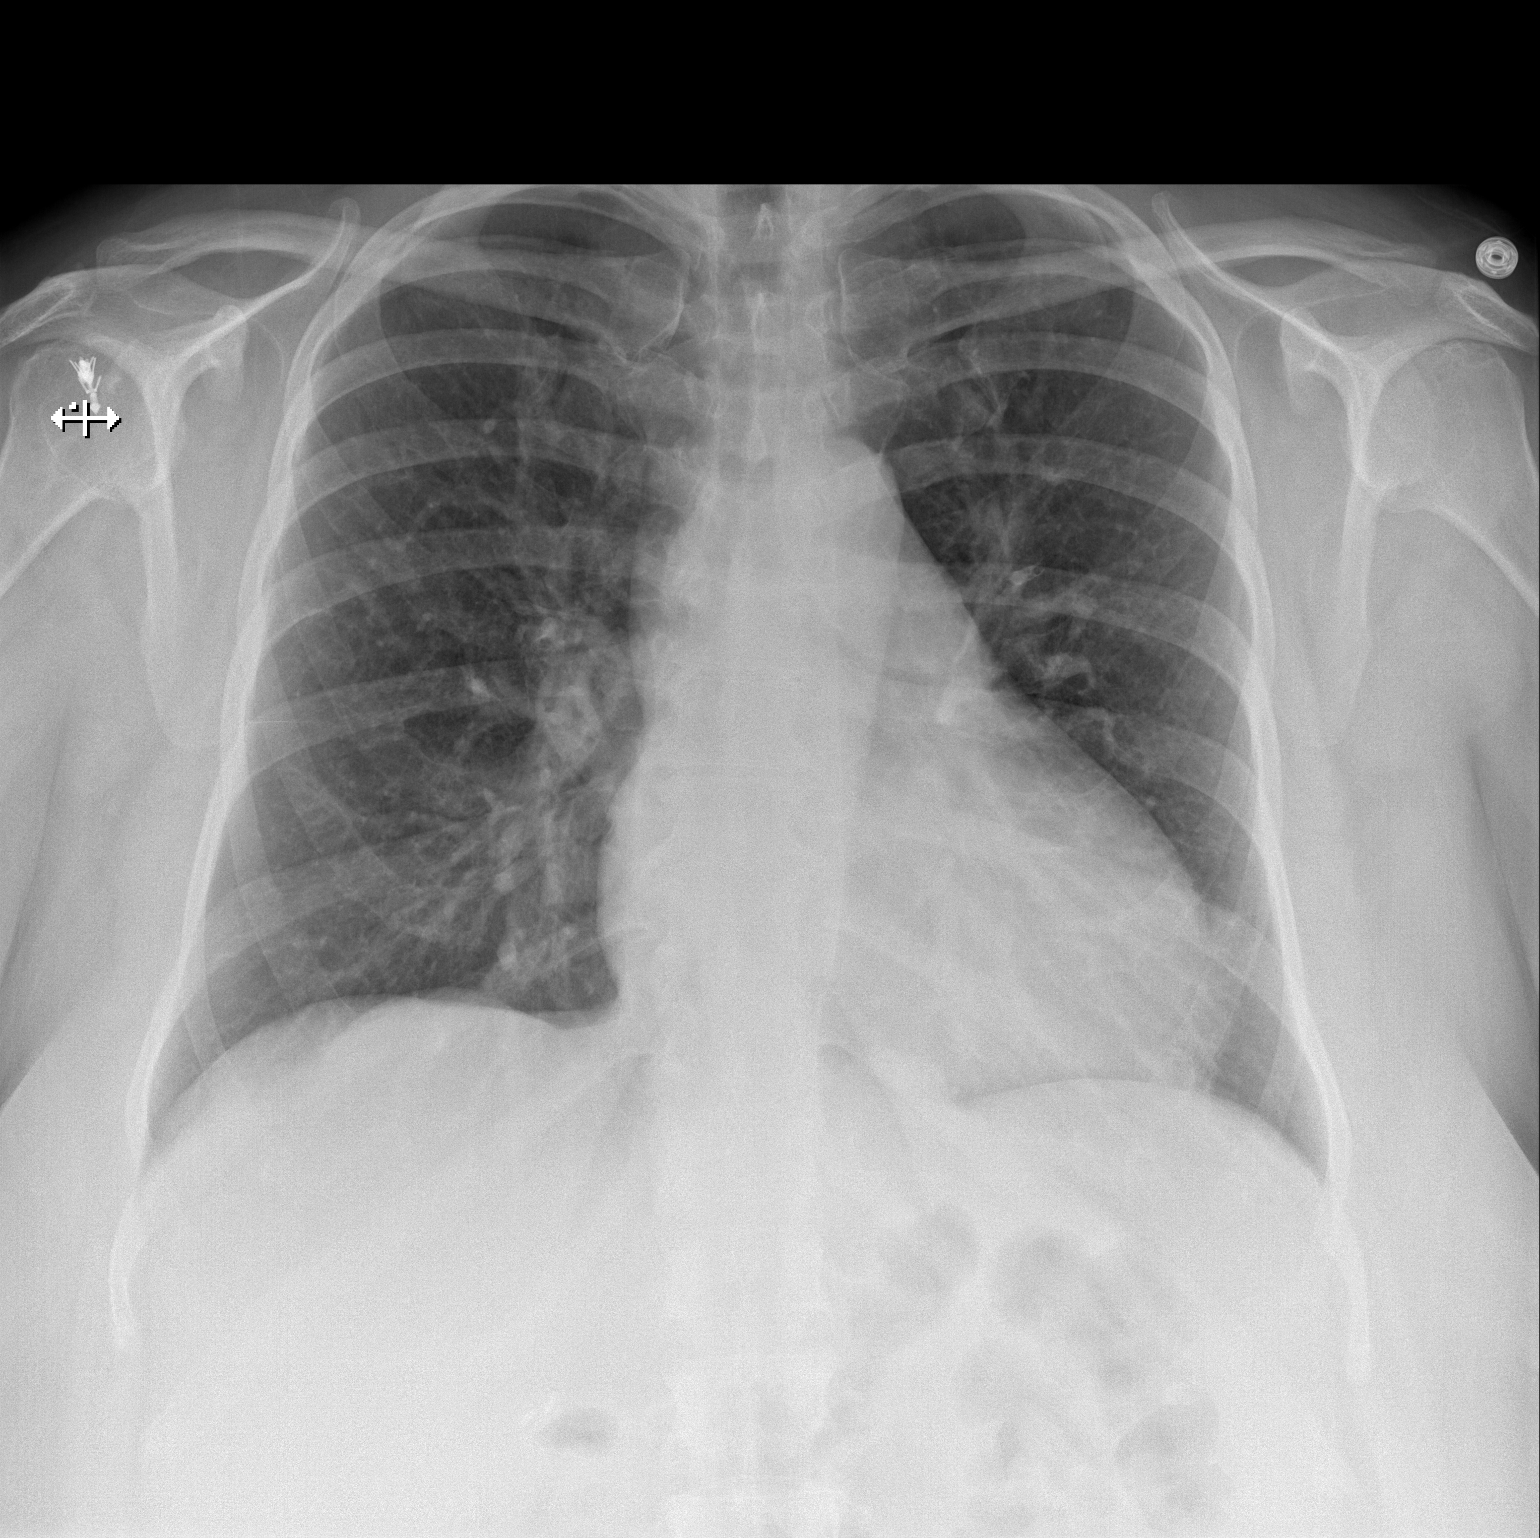

[w chest lat]
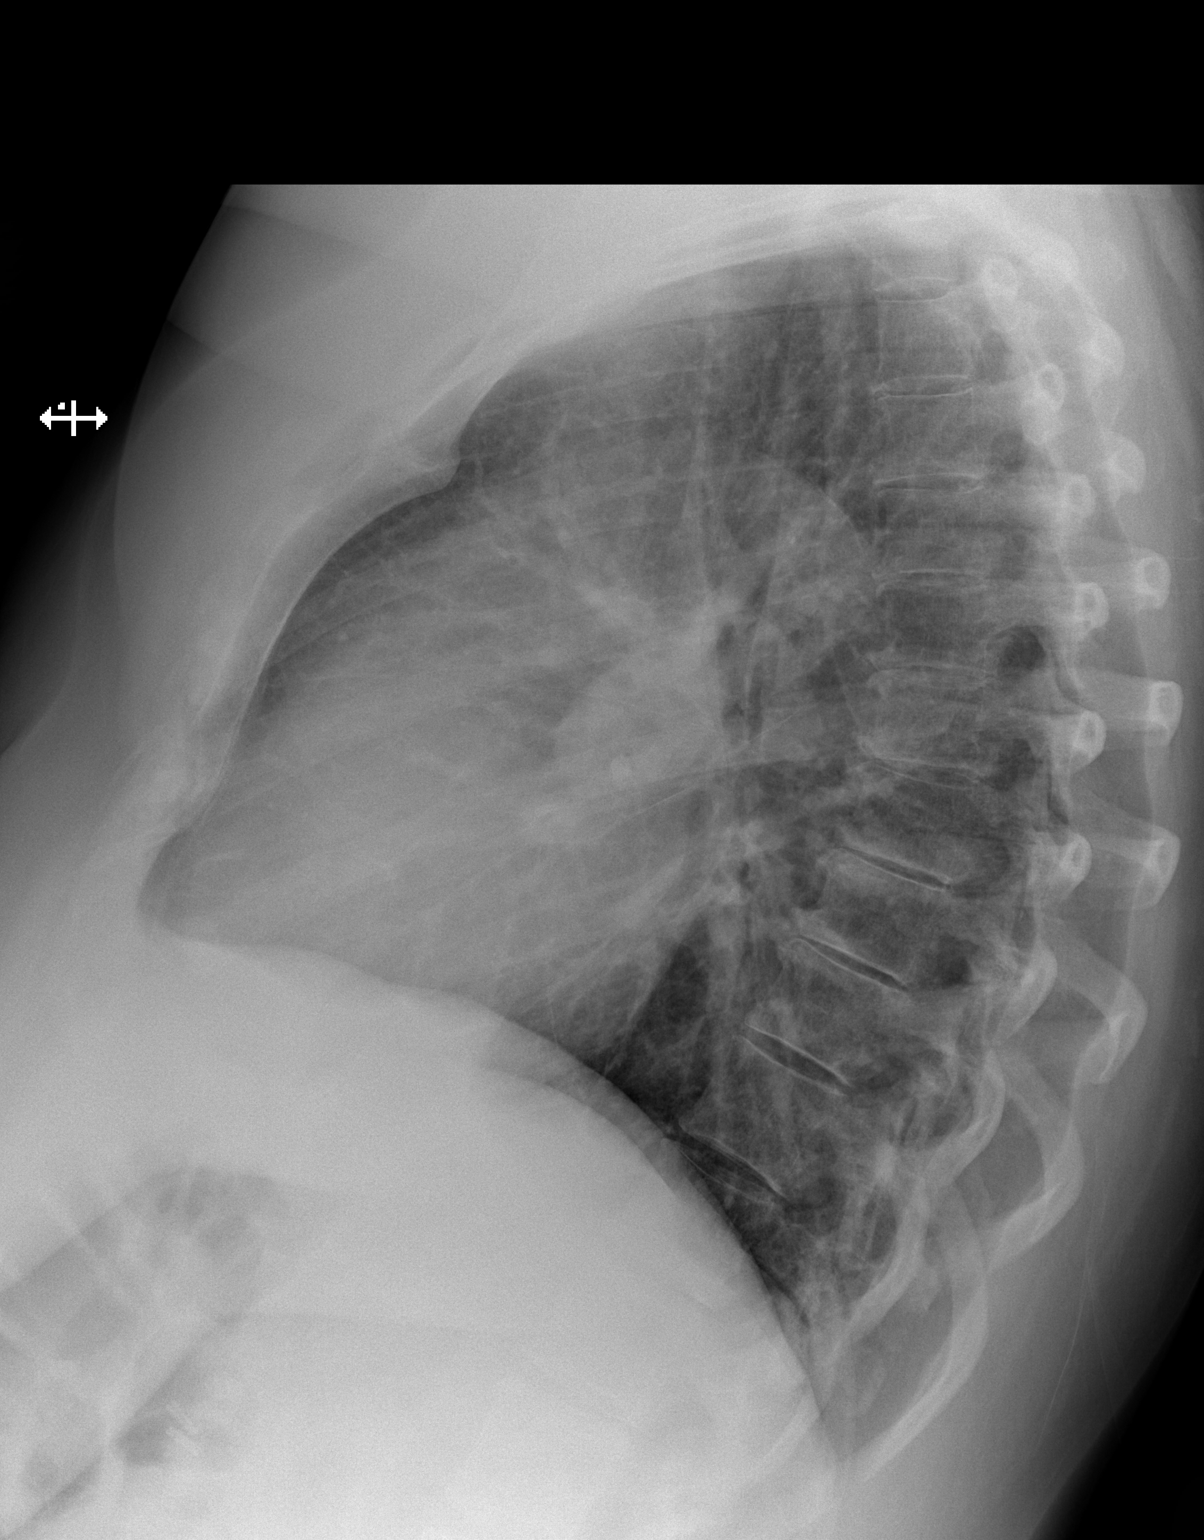

[2 of 2 positions shown; findings below may reference images not displayed]

FINDINGS: Cardiomegaly again noted.  No acute infiltrate or pleural
effusion.  No pulmonary edema.  Stable degenerative changes mid and
lower thoracic spine.
IMPRESSION: No active disease.  Cardiomegaly again noted.

## 2013-08-25 ENCOUNTER — Other Ambulatory Visit: Payer: BC Managed Care – PPO

## 2013-10-15 IMAGING — CR DG WRIST COMPLETE 3+V*R*
1 series · 5 of 5 positions shown · non-contrast
Comparison: none

REASON FOR EXAM: pain and swelling after falling yesterday decrease rom
COMMENTS:

PROCEDURE:     MDR - MDR WRIST RT COMP WITH OBLIQUES  - August 16, 2013  [DATE]
RESULT:     There is no evidence of fracture, dislocation, or malalignment.

[Series 1: pa · 0.17mm/px · 5 of 5 slices shown]
[im 1/5]
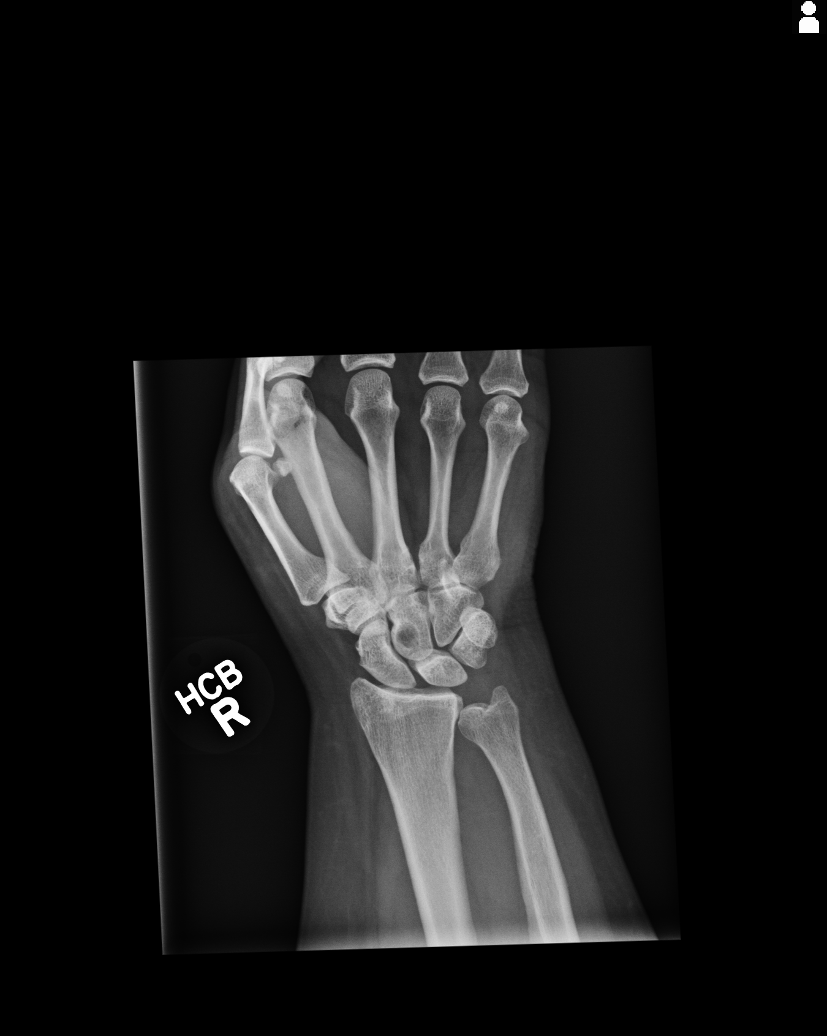
[im 2/5]
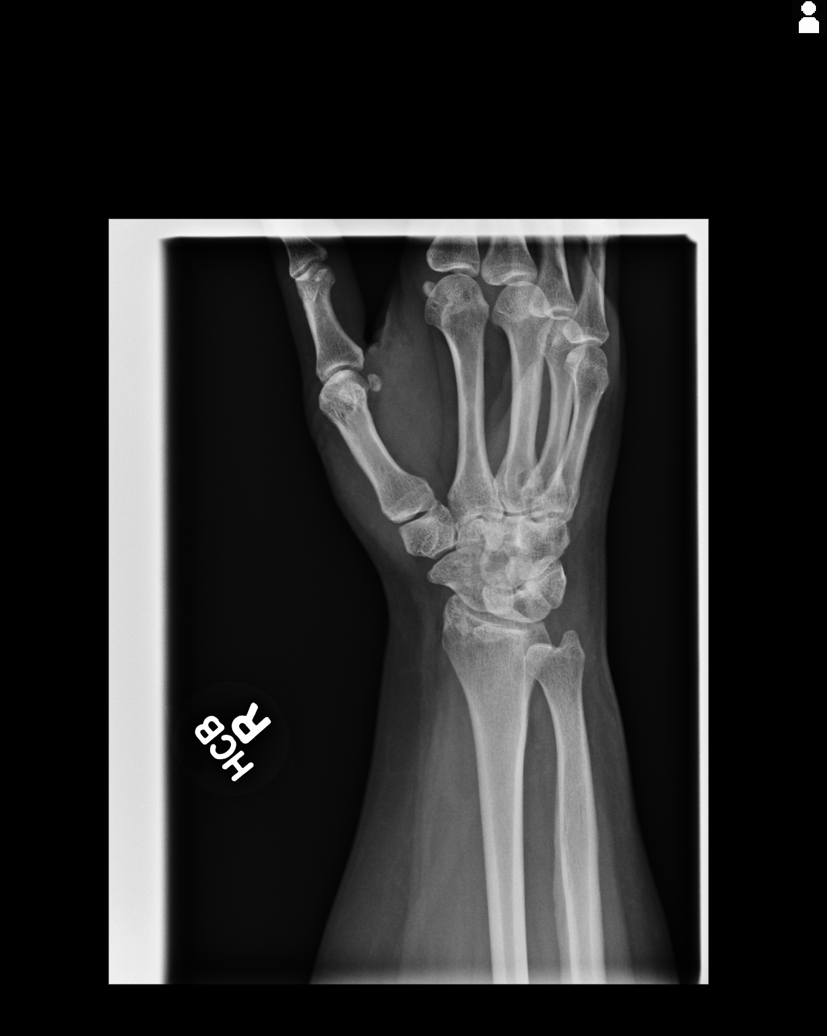
[im 3/5]
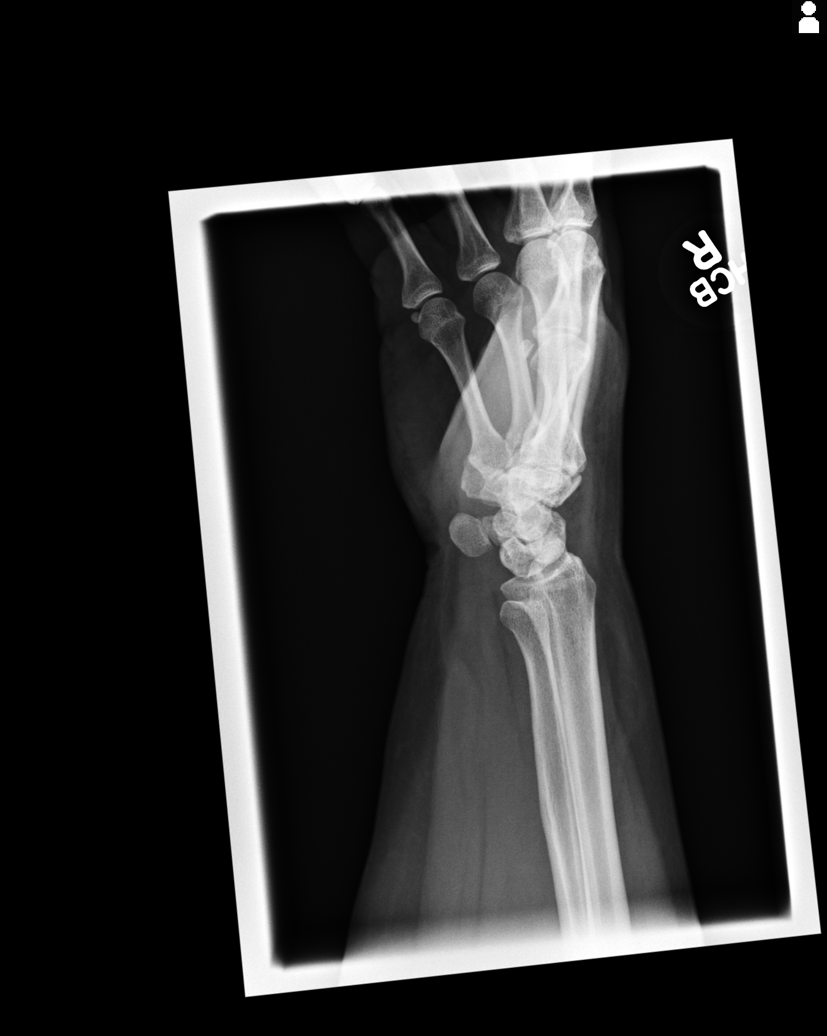
[im 4/5]
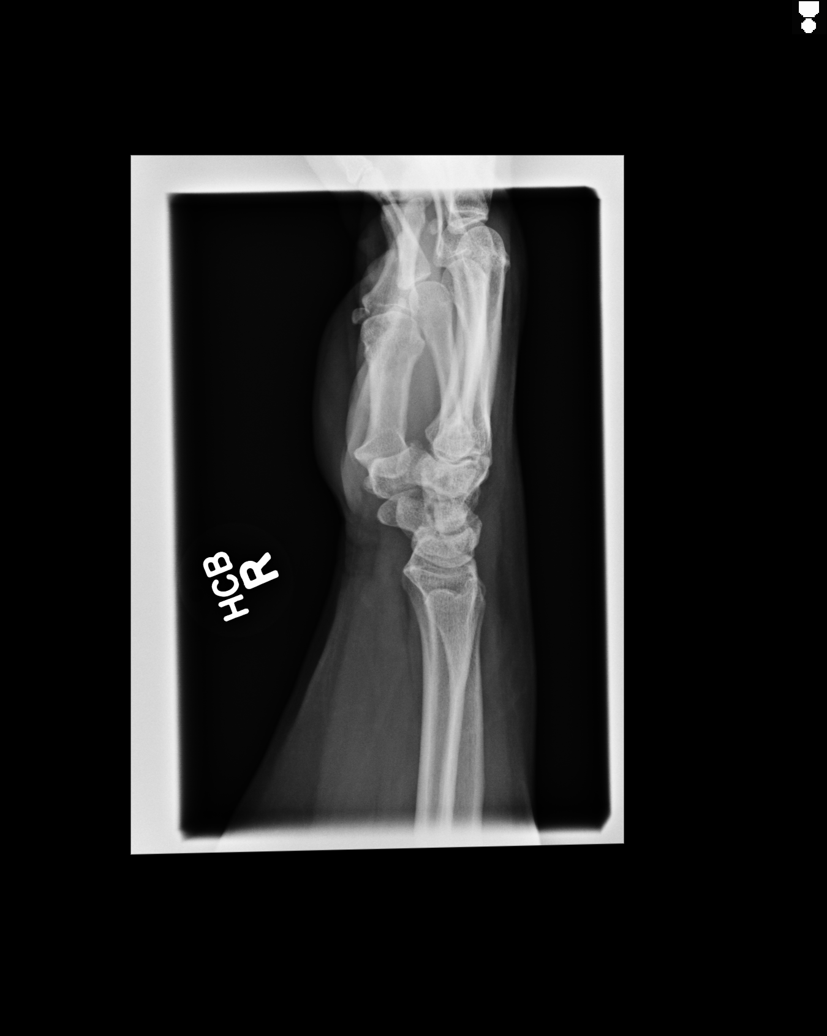
[im 5/5]
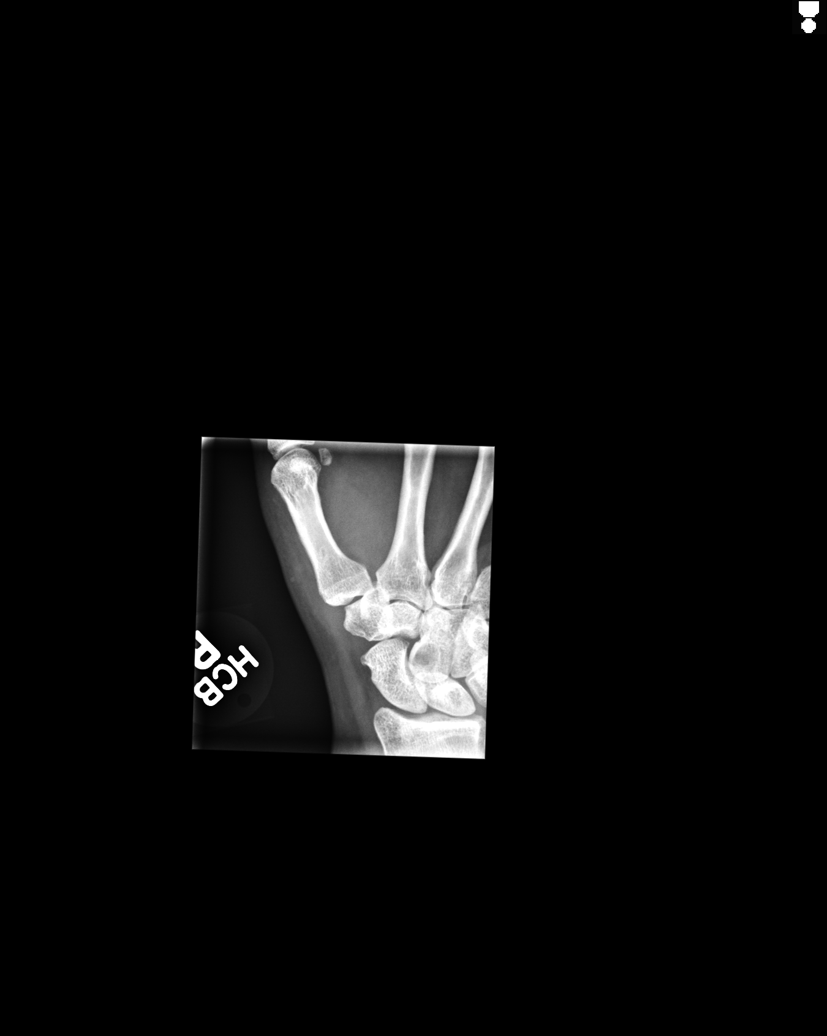

[5 of 5 positions shown; findings below may reference images not displayed]

IMPRESSION: 1. No evidence of acute abnormalities.
2. If there are persistent complaints of pain or persistent clinical
concern, a repeat evaluation in 7-10 days is recommended if clinically
warranted.

## 2015-02-22 NOTE — Discharge Summary (Signed)
PATIENT NAME:  [name], Karina A MR#:  [id number] DATE OF BIRTH:  1961-01-25  DATE OFBeckie [name] ADMISSION:  04/29/2012 DATE OF DISCHARGE:  05/02/2012  ADMITTING DIAGNOSIS: Morbid obesity associated with hypertension, obstructive sleep apnea, and hyperlipidemia.   SPECIAL PROCEDURES: Laparoscopic lysis of adhesions, segmental resection of small bowel followed by sleeve gastrectomy and repair of hiatal hernia.   HOSPITAL COURSE: The patient was admitted on 04/29/2012 anticipating a gastric bypass. However, intraoperatively, the patient was noted to have extensive intra-abdominal adhesions limiting access to lower abdomen. During the dissection, the patient did have an enterotomy which prompted segmental resection of small bowel. Following this it was deemed progressively unsafe for gastric bypass. It was decided at this point to convert to a sleeve gastrectomy. The patient tolerated the operative procedure well. The patient's postoperative course in general was unremarkable. She did require encouragement to pursue pulmonary toilet and ambulation. She was converted from IV PCA to Ultram because of issues with narcotics in the past. The patient on the second postoperative day was noted to have significant headache associated with nausea. Again this limited increase in activities. The patient was given Fioricet which ultimately helped relieve her headaches. She was also given Dulcolax suppository to assist with initiation of bowel function. By the morning of 05/02/2012 the patient was feeling much better and warranted discharge. She was given instructions to followup in our office in two weeks. The patient is to pursue bariatric full liquid diet protocol. She has an active prescription for Ultram one every 4 hours as needed for pain, also Zofran ODT one sublingual every 6 hours p.r.n. nausea. ____________________________ Tyrone AppleMichael A. Alva Garnetyner, MD mat:slb D: 05/19/2012 19:50:49 ET     T: 05/20/2012 12:13:54 ET         JOB#: [id number] cc: Casimiro NeedleMichael A. Alva Garnetyner, MD, <Dictator> Everette RankMICHAEL A Conny Situ MD ELECTRONICALLY SIGNED 06/10/2012 16:13

## 2015-02-27 NOTE — Op Note (Signed)
PATIENT NAME:  Karina [name], Karina [name] MR#:  [id number] DATE OF BIRTH:  29-Jun-1961  DATE OF PROCEDURE:  04/29/2012  PROCEDURES PERFORMED:  1. Laparoscopic lysis of adhesions. 2. Segmental resection of small bowel. 3. Sleeve gastrectomy with repair of hiatal hernia.   PREOPERATIVE DIAGNOSIS: Morbid obesity with BMI of 41 associated with hypertension, obstructive sleep apnea and hyperlipidemia.   POSTOPERATIVE DIAGNOSES: Morbid obesity with BMI of 41 associated with hypertension, obstructive sleep apnea and hyperlipidemia with extensive small bowel and omental adhesions associated with prior appendectomy and hysterectomy. The preoperative plan included gastric bypass. The intraoperative findings prompted conversion from [name] bypass to [name] sleeve gastrectomy.   SURGEON: Tyrone AppleMichael [name]. Tyner, MD  DESCRIPTION OF PROCEDURE: The patient was brought to the Operating Room, placed in supine position. General anesthesia obtained with orotracheal intubation. Foley catheter inserted sterilely. SCDs applied. Foot board placed at the end of the operative bed. The abdomen was sterilely prepped and draped. [name] left upper quadrant 5 mm trocar introduced under direct visualization. Pneumoperitoneum obtained with carbon dioxide. Patient did not have adhesions in the central and right lower quadrant of the abdomen. Area above this identified for introduction of additional trocars. [name] 5 mm trocar and [name] 12 mm trocar introduced in the right upper quadrant. The prior noted adhesions were addressed after an additional 5 mm trocar introduced in the left upper abdomen. The adhesions were noted to be quite dense and there are several areas where the small bowel was densely adherent to the anterior fascia. It was felt to be appropriate at this point to remove anticipating gastric bypass. During the dissection an enterotomy occurred in the small bowel. There was also deserosalization of [name] portion of the cecum which was immediately adjacent to this.  The omentum also noted to be densely adherent both to the anterior abdominal wall. In addition to this there were several areas were the omentum was densely adherent to several loops of small bowel. These adhesions were taken down by sharp dissection and occasional use of the Harmonic scalpel. Multiple interloop adhesions were also incurred and these were divided in the region of the bowel involved in the enterotomy with sharp dissection. At this point it is felt that further extensive adhesions would inhibit the ability to do [name] gastric bypass. It was decided to proceed with direct repair of the site of the enterotomy. [name] segmental resection was felt to be appropriate. The bowel immediately distal to the enterotomy were managed by creation of mesenteric windows by Bovie cautery followed by division of the bowel again immediately proximal and distal with the use of [name] white load GIA stapler. [name] side-to-side anastomosis was configured. Enterotomies made on the antimesenteric border of the proximal and distal bowel. [name] white load GI stapler introduced into the lumens creating [name] common channel. The resulting enterotomy closed with the white load GIA stapler. The bowel immediately proximal to this and distal to this approximated to prevent any torsion along the staple line. Mesenteric window also approximated with running 0 Ethibond suture. Repeat inspection confirmed the absence of any additional enterotomies. The deserosalized portion of the cecum oversewn with running 2-0 Ethibond suture. At this point, attention directed to the upper abdomen. It should be noted that to accomplish this an original 5 mm trocar in the right lateral abdomen was converted to [name] 12 mm trocar. The patient had [name] Nathanson liver retractor introduced through [name] subxiphoid wound. The patient then had division of multiple gastric pedicles along the greater curvature of the  stomach beginning approximately 4 cm proximal to the pylorus. This was extended  with division of short gastric vessels and mobilization of the upper fundus from the left hemidiaphragm. During this dissection patient noted to have the GE junction lying just within the hiatus. Given the potential for exacerbated reflux disease it was decided to proceed with repair of this. Patient had division of portions of the gastrohepatic ligament and the herniated peritoneum at the anterior hiatus divided by use of the Harmonic scalpel. Blunt dissection was then used to create [name] preperitoneal plane in the lower mediastinum reducing the peritoneum and also mobilizing the anterior esophagus away from the overlying pericardium. Care taken to avoid the anterior vagus nerve which was well identified. Patient then had division of residual phrenoesophageal ligaments along the lateral aspect of the esophagus. On the right side the peritoneum along the right crus was divided and the herniated lesser sac fatty tissue returned to the abdominal cavity. The esophagus swept away from the underlying aorta and the pleural surfaces on the right and left side. This was extended lower mediastinum over [name] distance approximately 5 cm. Patient then had [name] posterior crural repair with interrupted 0 Ethibond suture. At this point, [name] 48 French bougie was passed transorally and directed in the region of the antrum. Next, multiple GI staplers were used to create [name] medially based gastric tube effect. The first two firings were placed in relative transverse direction in an effort to avoid narrowing at the level of the incisura. Next, [name] vertical line of staples were placed added with the use of Seamguard staple reinforcement system to diminish the potential for bleeding along the staple line. This was brought out just lateral to the angle of His. The staple line was noted to be very consistent. The lateral stomach was then delivered to the right upper abdominal 12 mm trocar site after the fascia was dilated. The stomach delivered as [name]  specimen. The staple line inspected for hemostasis and found to be adequate. The fascia and peritoneum of this defect closed with 0 Vicryl suture passed under direct visualization with [name] needle suture passing system. The pneumoperitoneum relieved at this point. The trocars removed and the wounds injected with 0.25% Marcaine followed by 4-0 Monocryl to the dermis followed by Dermabond. Patient allowed to recover at this point having tolerated the procedure well.   ____________________________ Tyrone Apple. Alva Garnet, MD mat:cms D: 04/30/2012 07:01:23 ET T: 04/30/2012 09:22:01 ET JOB#: [id number]  cc: Casimiro Needle [name]. Alva Garnet, MD, <Dictator>  Everette Rank MD ELECTRONICALLY SIGNED 05/01/2012 21:43

## 2017-05-14 ENCOUNTER — Telehealth (INDEPENDENT_AMBULATORY_CARE_PROVIDER_SITE_OTHER): Payer: Self-pay | Admitting: Orthopaedic Surgery

## 2017-05-14 NOTE — Telephone Encounter (Signed)
I received VM from patient wanting copy of records. I returned call and left msg advising we need signed release form and that if she can't come by the office to let us know and we can fax or mail one to her.

## 2017-05-20 ENCOUNTER — Telehealth (INDEPENDENT_AMBULATORY_CARE_PROVIDER_SITE_OTHER): Payer: Self-pay | Admitting: Orthopaedic Surgery

## 2017-05-20 NOTE — Telephone Encounter (Signed)
Copy of [facility] records mailed to [facility] [address] [place]
